# Patient Record
Sex: Female | Born: 1950
Health system: Southern US, Community
[De-identification: ages and names within clinical notes are randomized; demographics above are authoritative.]

## PROBLEM LIST (undated history)

## (undated) DIAGNOSIS — F17200 Nicotine dependence, unspecified, uncomplicated: Secondary | ICD-10-CM

## (undated) DIAGNOSIS — C801 Malignant (primary) neoplasm, unspecified: Secondary | ICD-10-CM

## (undated) DIAGNOSIS — K219 Gastro-esophageal reflux disease without esophagitis: Secondary | ICD-10-CM

## (undated) DIAGNOSIS — R630 Anorexia: Secondary | ICD-10-CM

## (undated) DIAGNOSIS — J3800 Paralysis of vocal cords and larynx, unspecified: Secondary | ICD-10-CM

## (undated) DIAGNOSIS — IMO0002 Reserved for concepts with insufficient information to code with codable children: Secondary | ICD-10-CM

## (undated) DIAGNOSIS — E0590 Thyrotoxicosis, unspecified without thyrotoxic crisis or storm: Secondary | ICD-10-CM

## (undated) DIAGNOSIS — Z860100 Personal history of colon polyps, unspecified: Secondary | ICD-10-CM

## (undated) DIAGNOSIS — J438 Other emphysema: Secondary | ICD-10-CM

## (undated) DIAGNOSIS — K624 Stenosis of anus and rectum: Secondary | ICD-10-CM

## (undated) DIAGNOSIS — K5732 Diverticulitis of large intestine without perforation or abscess without bleeding: Secondary | ICD-10-CM

## (undated) DIAGNOSIS — N824 Other female intestinal-genital tract fistulae: Secondary | ICD-10-CM

## (undated) DIAGNOSIS — M810 Age-related osteoporosis without current pathological fracture: Secondary | ICD-10-CM

## (undated) HISTORY — PX: COLOSTOMY: SHX63

## (undated) HISTORY — DX: Age-related osteoporosis without current pathological fracture: M81.0

## (undated) HISTORY — DX: Paralysis of vocal cords and larynx, unspecified: J38.00

## (undated) HISTORY — PX: DILATION AND CURETTAGE OF UTERUS: SHX78

## (undated) HISTORY — PX: COLONOSCOPY: SHX174

## (undated) HISTORY — PX: OTHER SURGICAL HISTORY: SHX169

## (undated) HISTORY — DX: Other emphysema: J43.8

## (undated) HISTORY — DX: Reserved for concepts with insufficient information to code with codable children: IMO0002

## (undated) HISTORY — DX: Stenosis of anus and rectum: K62.4

## (undated) HISTORY — PX: TUBAL LIGATION: SHX77

## (undated) HISTORY — PX: SIGMOID RESECTION / RECTOPEXY: SUR1294

## (undated) HISTORY — DX: Other female intestinal-genital tract fistulae: N82.4

## (undated) HISTORY — PX: WRIST SURGERY: SHX841

## (undated) HISTORY — DX: Personal history of colon polyps, unspecified: Z86.0100

## (undated) HISTORY — DX: Thyrotoxicosis, unspecified without thyrotoxic crisis or storm: E05.90

## (undated) HISTORY — PX: POLYPECTOMY: SHX149

## (undated) HISTORY — DX: Personal history of colonic polyps: Z86.010

## (undated) HISTORY — DX: Diverticulitis of large intestine without perforation or abscess without bleeding: K57.32

## (undated) HISTORY — DX: Anorexia: R63.0

## (undated) HISTORY — DX: Gastro-esophageal reflux disease without esophagitis: K21.9

## (undated) HISTORY — DX: Malignant (primary) neoplasm, unspecified: C80.1

## (undated) HISTORY — DX: Nicotine dependence, unspecified, uncomplicated: F17.200

---

## 1997-10-10 ENCOUNTER — Ambulatory Visit (HOSPITAL_COMMUNITY): Admission: RE | Admit: 1997-10-10 | Discharge: 1997-10-10 | Payer: Self-pay | Admitting: Endocrinology

## 1998-03-20 ENCOUNTER — Ambulatory Visit (HOSPITAL_COMMUNITY): Admission: RE | Admit: 1998-03-20 | Discharge: 1998-03-20 | Payer: Self-pay | Admitting: Internal Medicine

## 1998-04-02 ENCOUNTER — Inpatient Hospital Stay (HOSPITAL_COMMUNITY): Admission: AD | Admit: 1998-04-02 | Discharge: 1998-04-13 | Payer: Self-pay | Admitting: Internal Medicine

## 1998-04-02 ENCOUNTER — Encounter: Payer: Self-pay | Admitting: Internal Medicine

## 1998-06-01 ENCOUNTER — Ambulatory Visit (HOSPITAL_BASED_OUTPATIENT_CLINIC_OR_DEPARTMENT_OTHER): Admission: RE | Admit: 1998-06-01 | Discharge: 1998-06-01 | Payer: Self-pay | Admitting: General Surgery

## 1998-07-27 ENCOUNTER — Inpatient Hospital Stay (HOSPITAL_COMMUNITY): Admission: RE | Admit: 1998-07-27 | Discharge: 1998-07-31 | Payer: Self-pay | Admitting: General Surgery

## 1999-11-19 ENCOUNTER — Other Ambulatory Visit: Admission: RE | Admit: 1999-11-19 | Discharge: 1999-11-19 | Payer: Self-pay | Admitting: Obstetrics and Gynecology

## 2001-01-08 ENCOUNTER — Other Ambulatory Visit: Admission: RE | Admit: 2001-01-08 | Discharge: 2001-01-08 | Payer: Self-pay | Admitting: Obstetrics and Gynecology

## 2001-09-11 ENCOUNTER — Emergency Department (HOSPITAL_COMMUNITY): Admission: EM | Admit: 2001-09-11 | Discharge: 2001-09-11 | Payer: Self-pay

## 2001-09-11 ENCOUNTER — Encounter (INDEPENDENT_AMBULATORY_CARE_PROVIDER_SITE_OTHER): Payer: Self-pay | Admitting: *Deleted

## 2001-09-13 ENCOUNTER — Ambulatory Visit (HOSPITAL_COMMUNITY): Admission: RE | Admit: 2001-09-13 | Discharge: 2001-09-13 | Payer: Self-pay | Admitting: Internal Medicine

## 2001-09-13 ENCOUNTER — Encounter: Payer: Self-pay | Admitting: Internal Medicine

## 2001-11-03 ENCOUNTER — Ambulatory Visit (HOSPITAL_COMMUNITY): Admission: RE | Admit: 2001-11-03 | Discharge: 2001-11-03 | Payer: Self-pay | Admitting: Internal Medicine

## 2001-11-04 ENCOUNTER — Encounter: Payer: Self-pay | Admitting: Internal Medicine

## 2002-01-12 ENCOUNTER — Other Ambulatory Visit: Admission: RE | Admit: 2002-01-12 | Discharge: 2002-01-12 | Payer: Self-pay | Admitting: Obstetrics and Gynecology

## 2002-01-18 ENCOUNTER — Ambulatory Visit (HOSPITAL_COMMUNITY): Admission: RE | Admit: 2002-01-18 | Discharge: 2002-01-18 | Payer: Self-pay | Admitting: Obstetrics and Gynecology

## 2002-01-18 ENCOUNTER — Encounter: Payer: Self-pay | Admitting: Obstetrics and Gynecology

## 2002-07-05 ENCOUNTER — Encounter: Admission: RE | Admit: 2002-07-05 | Discharge: 2002-07-05 | Payer: Self-pay | Admitting: Internal Medicine

## 2002-07-05 ENCOUNTER — Encounter: Payer: Self-pay | Admitting: Internal Medicine

## 2002-09-27 ENCOUNTER — Encounter: Admission: RE | Admit: 2002-09-27 | Discharge: 2002-12-26 | Payer: Self-pay | Admitting: Internal Medicine

## 2003-02-07 ENCOUNTER — Encounter: Payer: Self-pay | Admitting: Internal Medicine

## 2003-02-07 ENCOUNTER — Encounter: Admission: RE | Admit: 2003-02-07 | Discharge: 2003-02-07 | Payer: Self-pay | Admitting: Internal Medicine

## 2004-07-17 ENCOUNTER — Ambulatory Visit: Payer: Self-pay | Admitting: Internal Medicine

## 2004-08-22 ENCOUNTER — Ambulatory Visit (HOSPITAL_COMMUNITY): Admission: RE | Admit: 2004-08-22 | Discharge: 2004-08-22 | Payer: Self-pay | Admitting: Internal Medicine

## 2004-09-09 ENCOUNTER — Ambulatory Visit: Payer: Self-pay | Admitting: Internal Medicine

## 2004-09-13 ENCOUNTER — Encounter: Admission: RE | Admit: 2004-09-13 | Discharge: 2004-12-12 | Payer: Self-pay | Admitting: Internal Medicine

## 2004-11-07 ENCOUNTER — Ambulatory Visit: Payer: Self-pay | Admitting: Internal Medicine

## 2004-11-28 ENCOUNTER — Ambulatory Visit: Payer: Self-pay | Admitting: Endocrinology

## 2004-12-03 ENCOUNTER — Ambulatory Visit: Payer: Self-pay | Admitting: Internal Medicine

## 2004-12-31 ENCOUNTER — Ambulatory Visit: Payer: Self-pay | Admitting: Endocrinology

## 2005-01-14 ENCOUNTER — Ambulatory Visit: Payer: Self-pay | Admitting: Endocrinology

## 2005-01-21 ENCOUNTER — Ambulatory Visit: Payer: Self-pay | Admitting: Endocrinology

## 2005-02-04 ENCOUNTER — Ambulatory Visit: Payer: Self-pay | Admitting: Endocrinology

## 2005-03-07 ENCOUNTER — Ambulatory Visit: Payer: Self-pay | Admitting: Endocrinology

## 2005-03-31 ENCOUNTER — Ambulatory Visit: Payer: Self-pay | Admitting: Endocrinology

## 2005-04-07 ENCOUNTER — Ambulatory Visit: Payer: Self-pay | Admitting: Endocrinology

## 2005-08-06 ENCOUNTER — Other Ambulatory Visit: Admission: RE | Admit: 2005-08-06 | Discharge: 2005-08-06 | Payer: Self-pay | Admitting: Obstetrics and Gynecology

## 2005-10-22 ENCOUNTER — Encounter: Payer: Self-pay | Admitting: Internal Medicine

## 2006-01-29 ENCOUNTER — Ambulatory Visit: Payer: Self-pay | Admitting: Endocrinology

## 2006-03-09 ENCOUNTER — Ambulatory Visit: Payer: Self-pay | Admitting: Endocrinology

## 2006-03-09 ENCOUNTER — Ambulatory Visit: Payer: Self-pay | Admitting: Internal Medicine

## 2006-04-08 ENCOUNTER — Ambulatory Visit: Payer: Self-pay | Admitting: Endocrinology

## 2006-05-22 ENCOUNTER — Ambulatory Visit: Payer: Self-pay | Admitting: Endocrinology

## 2006-08-20 ENCOUNTER — Ambulatory Visit: Payer: Self-pay | Admitting: Endocrinology

## 2006-11-24 ENCOUNTER — Ambulatory Visit: Payer: Self-pay | Admitting: Internal Medicine

## 2006-12-04 ENCOUNTER — Ambulatory Visit: Payer: Self-pay | Admitting: Internal Medicine

## 2006-12-04 ENCOUNTER — Encounter: Payer: Self-pay | Admitting: Internal Medicine

## 2007-09-13 ENCOUNTER — Encounter: Payer: Self-pay | Admitting: Endocrinology

## 2007-10-14 ENCOUNTER — Ambulatory Visit: Payer: Self-pay | Admitting: Endocrinology

## 2007-10-14 DIAGNOSIS — E1065 Type 1 diabetes mellitus with hyperglycemia: Secondary | ICD-10-CM

## 2007-10-14 DIAGNOSIS — IMO0002 Reserved for concepts with insufficient information to code with codable children: Secondary | ICD-10-CM | POA: Insufficient documentation

## 2007-10-14 LAB — CONVERTED CEMR LAB
Creatinine,U: 13.5 mg/dL
Hgb A1c MFr Bld: 10.2 % — ABNORMAL HIGH (ref 4.6–6.0)

## 2007-11-04 ENCOUNTER — Telehealth: Payer: Self-pay | Admitting: Endocrinology

## 2007-11-13 DIAGNOSIS — E109 Type 1 diabetes mellitus without complications: Secondary | ICD-10-CM | POA: Insufficient documentation

## 2007-11-13 DIAGNOSIS — F17200 Nicotine dependence, unspecified, uncomplicated: Secondary | ICD-10-CM

## 2007-11-13 DIAGNOSIS — K5730 Diverticulosis of large intestine without perforation or abscess without bleeding: Secondary | ICD-10-CM | POA: Insufficient documentation

## 2007-11-13 DIAGNOSIS — K5732 Diverticulitis of large intestine without perforation or abscess without bleeding: Secondary | ICD-10-CM

## 2007-11-13 DIAGNOSIS — K624 Stenosis of anus and rectum: Secondary | ICD-10-CM

## 2008-01-19 ENCOUNTER — Encounter: Payer: Self-pay | Admitting: Internal Medicine

## 2008-01-24 ENCOUNTER — Encounter: Admission: RE | Admit: 2008-01-24 | Discharge: 2008-01-24 | Payer: Self-pay | Admitting: Rheumatology

## 2008-01-31 ENCOUNTER — Encounter: Payer: Self-pay | Admitting: Internal Medicine

## 2008-05-08 ENCOUNTER — Encounter: Payer: Self-pay | Admitting: Endocrinology

## 2008-05-31 ENCOUNTER — Ambulatory Visit (HOSPITAL_COMMUNITY): Admission: RE | Admit: 2008-05-31 | Discharge: 2008-06-01 | Payer: Self-pay | Admitting: Otolaryngology

## 2008-05-31 ENCOUNTER — Encounter (INDEPENDENT_AMBULATORY_CARE_PROVIDER_SITE_OTHER): Payer: Self-pay | Admitting: Otolaryngology

## 2008-12-18 ENCOUNTER — Telehealth: Payer: Self-pay | Admitting: Endocrinology

## 2008-12-19 ENCOUNTER — Ambulatory Visit: Payer: Self-pay | Admitting: Endocrinology

## 2008-12-19 LAB — CONVERTED CEMR LAB
BUN: 19 mg/dL (ref 6–23)
CO2: 27 meq/L (ref 19–32)
Chloride: 103 meq/L (ref 96–112)
Creatinine, Ser: 0.9 mg/dL (ref 0.4–1.2)
Potassium: 5 meq/L (ref 3.5–5.1)

## 2009-03-20 ENCOUNTER — Telehealth: Payer: Self-pay | Admitting: Internal Medicine

## 2009-03-23 ENCOUNTER — Encounter: Payer: Self-pay | Admitting: Endocrinology

## 2009-09-21 ENCOUNTER — Encounter (INDEPENDENT_AMBULATORY_CARE_PROVIDER_SITE_OTHER): Payer: Self-pay | Admitting: *Deleted

## 2009-10-18 ENCOUNTER — Encounter (INDEPENDENT_AMBULATORY_CARE_PROVIDER_SITE_OTHER): Payer: Self-pay | Admitting: *Deleted

## 2009-10-18 ENCOUNTER — Encounter: Admission: RE | Admit: 2009-10-18 | Discharge: 2009-10-18 | Payer: Self-pay | Admitting: Otolaryngology

## 2009-11-13 ENCOUNTER — Encounter (INDEPENDENT_AMBULATORY_CARE_PROVIDER_SITE_OTHER): Payer: Self-pay | Admitting: *Deleted

## 2010-01-29 ENCOUNTER — Encounter (INDEPENDENT_AMBULATORY_CARE_PROVIDER_SITE_OTHER): Payer: Self-pay | Admitting: *Deleted

## 2010-02-08 ENCOUNTER — Ambulatory Visit: Payer: Self-pay | Admitting: Internal Medicine

## 2010-02-08 DIAGNOSIS — Z860100 Personal history of colon polyps, unspecified: Secondary | ICD-10-CM | POA: Insufficient documentation

## 2010-02-08 DIAGNOSIS — N824 Other female intestinal-genital tract fistulae: Secondary | ICD-10-CM

## 2010-02-08 DIAGNOSIS — M810 Age-related osteoporosis without current pathological fracture: Secondary | ICD-10-CM | POA: Insufficient documentation

## 2010-02-08 DIAGNOSIS — J438 Other emphysema: Secondary | ICD-10-CM

## 2010-02-08 DIAGNOSIS — R630 Anorexia: Secondary | ICD-10-CM

## 2010-02-08 DIAGNOSIS — C44309 Unspecified malignant neoplasm of skin of other parts of face: Secondary | ICD-10-CM | POA: Insufficient documentation

## 2010-02-08 DIAGNOSIS — E0590 Thyrotoxicosis, unspecified without thyrotoxic crisis or storm: Secondary | ICD-10-CM | POA: Insufficient documentation

## 2010-02-08 DIAGNOSIS — J3800 Paralysis of vocal cords and larynx, unspecified: Secondary | ICD-10-CM

## 2010-02-08 DIAGNOSIS — R109 Unspecified abdominal pain: Secondary | ICD-10-CM | POA: Insufficient documentation

## 2010-02-08 DIAGNOSIS — C44300 Unspecified malignant neoplasm of skin of unspecified part of face: Secondary | ICD-10-CM | POA: Insufficient documentation

## 2010-02-11 ENCOUNTER — Ambulatory Visit (HOSPITAL_COMMUNITY): Admission: RE | Admit: 2010-02-11 | Discharge: 2010-02-11 | Payer: Self-pay | Admitting: Internal Medicine

## 2010-02-11 ENCOUNTER — Ambulatory Visit: Payer: Self-pay | Admitting: Internal Medicine

## 2010-02-11 LAB — CONVERTED CEMR LAB
Basophils Absolute: 0.1 10*3/uL (ref 0.0–0.1)
Basophils Relative: 0.8 % (ref 0.0–3.0)
Eosinophils Absolute: 0.2 10*3/uL (ref 0.0–0.7)
Eosinophils Relative: 2 % (ref 0.0–5.0)
Lymphs Abs: 1.8 10*3/uL (ref 0.7–4.0)
MCHC: 34.4 g/dL (ref 30.0–36.0)
Monocytes Absolute: 0.6 10*3/uL (ref 0.1–1.0)
RBC: 3.74 M/uL — ABNORMAL LOW (ref 3.87–5.11)
RDW: 14.8 % — ABNORMAL HIGH (ref 11.5–14.6)
Saturation Ratios: 5.4 % — ABNORMAL LOW (ref 20.0–50.0)
Transferrin: 223.7 mg/dL (ref 212.0–360.0)

## 2010-02-12 ENCOUNTER — Encounter: Payer: Self-pay | Admitting: Internal Medicine

## 2010-02-14 ENCOUNTER — Ambulatory Visit: Payer: Self-pay | Admitting: Internal Medicine

## 2010-07-25 NOTE — Letter (Signed)
Summary: New Patient letter  [facility]  [address] [place], [place] [address]   Phone: [phone]  Fax: [phone]       01/29/2010 MRN: [id number]  [facility] [address] [place], [place]  [address]  Dear Ms. Date,  Welcome to the Gastroenterology Division at [facility].    You are scheduled to see Dr.  Lina Sar on April 18, 2010 at 10:30am on the 3rd floor at [facility], [address]  We ask that you try to arrive at our office 15 minutes prior to your appointment time to allow for check-in.  We would like you to complete the enclosed self-administered evaluation form prior to your visit and bring it with you on the day of your appointment.  We will review it with you.  Also, please bring a complete list of all your medications or, if you prefer, bring the medication bottles and we will list them.  Please bring your insurance card so that we may make a copy of it.  If your insurance requires a referral to see a specialist, please bring your referral form from your primary care physician.  Co-payments are due at the time of your visit and may be paid by cash, check or credit card.     Your office visit will consist of a consult with your physician (includes a physical exam), any laboratory testing he/she may order, scheduling of any necessary diagnostic testing (e.g. x-ray, ultrasound, CT-scan), and scheduling of a procedure (e.g. Endoscopy, Colonoscopy) if required.  Please allow enough time on your schedule to allow for any/all of these possibilities.    If you cannot keep your appointment, please call [phone] to cancel or reschedule prior to your appointment date.  This allows [place] the opportunity to schedule an appointment for another patient in need of care.  If you do not cancel or reschedule by 5 p.m. the business day prior to your appointment date, you will be charged a $50.00 late cancellation/no-show fee.    Thank you for  choosing [facility] for your medical needs.  We appreciate the opportunity to care for you.  Please visit [place] at our website  to learn more about our practice.                     Sincerely,                                                             The Gastroenterology Division

## 2010-07-25 NOTE — Op Note (Signed)
Summary: Dilation of Sigmoid Stricture                         [facility]  Patient:    DELORICE, BANNISTER Visit Number: [id number] MRN: [id number]          Service Type: END Location: ENDO Attending Physician:  Mervin Hack Dictated by:   Hedwig Morton. Juanda Chance, M.D. [facility] Admit Date:  11/03/2001   CC:         Claire Shown. Zachery Dakins, M.D.   Procedure Report  PROCEDURE:  Dilatation of sigmoid stricture.  INDICATION:  This 60 year old white female has had an anastomotic stricture in the sigmoid colon at the level of 7 cm from the rectum.  This was dilated on September 13, 2001, after she presented to emergency room with partial sigmoid obstruction.  Patient has previous history of diverticulosis, diverticulitis, status post sigmoid resection with primary anastomosis.  Since the last dilatation she has remained asymptomatic, taking stool softeners on a daily basis.  She is undergoing repeat sigmoidoscopy to assess the success of the dilatation and to proceed with another dilatation.  The last time the dilators used were 14 and 15 Savary.  ENDOSCOPE:  Olympus single-channel video scope.  SEDATION:  Versed 7.5 mg IV, Demerol 70 mg IV.  FINDINGS:  Olympus single-channel pediatric upper endoscope was passed under direct vision through the rectum to sigmoid colon.  The sigmoid anastomosis was located at 7 cm from the anal verge, and it allowed at this time the endoscope to traverse freely through the stricture.  The diameter of the stricture was at least 14-15 mm.  There were no acute inflammatory changes of the stricture.  There were numerous diverticula proximal to the stricture. Endoscope passed all the way up to 35 cm from the rectum.  Guidewire was placed through the endoscope.  Endoscope was then retracted and Savary dilators were passed over the guidewire blindly using 15, 16, 17, and  18 mm dilators.  There was a small amount of blood on the last two dilators.  The patient  tolerated the procedure well.  IMPRESSION: 1. Benign anastomotic stricture of the sigmoid colon, status post dilatation    to 18 mm. 2. Diverticulosis of the left colon.  PLAN: 1. Resume previous diet. 2. Continue stool softeners on a daily basis. 3. Reassess in six months. Dictated by:   Hedwig Morton. Juanda Chance, M.D. [facility] Attending Physician:  Mervin Hack DD:  11/03/01 TD:  11/04/01 Job: [id number]

## 2010-07-25 NOTE — Procedures (Signed)
Summary: Instruction for procedure/[place]  Instruction for procedure/[place]   Imported By: Sherian Rein 02/12/2010 08:57:17  _____________________________________________________________________  External Attachment:    Type:   Image     Comment:   External Document

## 2010-07-25 NOTE — Op Note (Signed)
Summary: Flexible Sigmoidoscopy                         [facility]  Patient:    Michaela Walsh, Michaela Walsh Visit Number: [id number] MRN: [id number]          Service Type: END Location: ENDO Attending Physician:  Mervin Hack Dictated by:   Hedwig Morton. Juanda Chance, M.D. [facility] Admit Date:  09/13/2001   CC:         Anselm Pancoast. Zachery Dakins, M.D.   Procedure Report  PROCEDURE:  Flexible sigmoidoscopy and dilatation of anastomotic stricture.  ENDOSCOPIST:  Hedwig Morton. Juanda Chance, M.D.  INDICATIONS:  This 60 year old white female has presented to emergency room 2 days ago with acute left lower quadrant abdominal pain as well as suprapubic abdominal pain, associated with constipation.  She has a history of sigmoid resection for diverticulitis with perforation.  On CT scan of the abdomen and pelvis it became apparent that at the level of the rectosigmoid the colon developed a benign anastomotic stricture.  On digital examination this confirmed.  At tip of my finger there was a stricture which did not allow the entire finger to pass through it.  The patient has been dysimpacted over the weekend using GoLYTELY as well as other oral laxatives and a fleets enema and is now undergoing flexible sigmoidoscopy with assessment and evaluation of the anastomotic stricture and ______ dilatation.  ENDOSCOPE:  Olympus single channel videoscope.  SEDATION:  Versed 7 mg IV, Demerol 60 mg IV.  DESCRIPTION OF PROCEDURE:  The Olympus single channel videoscope was passed under direct vision to the sigmoid colon.  The patient was monitored by pulse oximeter.  Her oxygen saturation were satisfactory.   Anal canal and rectal ampulla was normal.  At the level of about 7 to 8 cm from the rectum there was a tight anastomotic stricture which initially did not allow the colonoscope to pass and transverse through into the colon.  We had to withdraw the colonoscope and use the pediatric upper endoscope which was admitted  through the stricture with mild pressure.  The diameter of the stricture was about 8 to 9 mm.  There were several ulcerations at the circumference of the stricture. Proximal to the stricture in the sigmoid colon were numerous diverticula, colonoscope was examined up to the level of 45 cm or distal to the splenic flexure.  At that point endoscope was slowly retracted.  Guidewire was placed through the upper endoscope into the sigmoid colon, endoscope was retracted and several dilators passed over the guidewire through the stricture.  The patient had a mild to moderate amount of discomfort with the dilatation using 14 mm, 15 mm.  The patient was re-endoscoped after the first dilatations to confirm dilatation of the stricture and no evidence of mucosal injury to the rest of the colon.  Guidewire was then reinserted and 2 more dilators were used to dilate the stricture using 16 and 77 mm dilators.  Again, guidewire was retracted and colon was re-endoscoped to confirm dilatation of the strictu and absence of any injury to the surrounding mucosa.  Next, the guidewire was placed again up to the level of 30 cm, and 3 more dilators were used, 18, 19 and 20 mm, several dilators passed over the guidewire without fluoroscopic guidance.  The stricture was rather tight and required some gentle pressure to be able to pass the largest dilator through. Re-endoscopy confirmed some bleeding from the dilated stricture but  no major tears.  The patient tolerated the procedure well.  IMPRESSION: 1. Rectosigmoid stricture at the anastomosis at the level of 7 cm., from the    anal verge. 2. Diverticulosis of the left colon. 3. Dilatation of the rectosigmoid stricture to 20 mm.  PLAN: 1. Resume previous diet. 2. Stool softeners on a daily basis. 3. Re-endoscope patient in 2-3 months to assess the healing of the stricture    and the surrounding ulcerations and to assess if further dilatation is    needed  at this point. Dictated by:   Hedwig Morton. Juanda Chance, M.D. [facility] Attending Physician:  Mervin Hack DD:  09/13/01 TD:  09/14/01 Job: [id number]

## 2010-07-25 NOTE — Assessment & Plan Note (Signed)
Summary: abd pain, decrease appetite...em    History of Present Illness Visit Type: Initial Consult Primary GI MD: Lina Sar MD Primary Jaydalynn Olivero: Darci Needle, MD Requesting Aariah Godette: Darci Needle, MD Chief Complaint: Patient complains of generalized abdominal pain that started about a month ago. She also complains of loss of appetite that started about the same time as the abdominal pain. Patinet has been dxed with a paralized vocal cord and trying to decide on if she would like to have surgery or not.  History of Present Illness:   This is a 60 year old white female with abdominal pain and decreased appetite. She has lost some weight. There is a history of  perforated diverticulitis in 1999, status post sigmoid resection and diverting colostomy with Hartman's pouch which was later taken down in 2000. She developed an anastomotic stricture at 7-8 cm from the rectum which was dilated in March and May 2003. Her last colonoscopy in June 2008 showed no evidence of the anastomotic stricture. She had a small polyp which consisted of lymphoid aggregates. Her abdominal and back pain is not related to meals or bowel movements. Patient denies drinking alcohol but admits to taking aspirin in excess for control of her back pain. Her energy level has been very low. She has severe degenerative joint disease of the lumbosacral spine. She is a diabetic.   GI Review of Systems    Reports abdominal pain, chest pain, heartburn, and  weight loss.     Location of  Abdominal pain: generalized. Weight loss of 12lbs pounds over 1 year.   Denies acid reflux, belching, bloating, dysphagia with liquids, dysphagia with solids, loss of appetite, nausea, vomiting, vomiting blood, and  weight gain.        Denies anal fissure, black tarry stools, change in bowel habit, constipation, diarrhea, diverticulosis, fecal incontinence, heme positive stool, hemorrhoids, irritable bowel syndrome, jaundice, light color stool, liver  problems, rectal bleeding, and  rectal pain. Preventive Screening-Counseling & Management  Alcohol-Tobacco     Smoking Status: quit    Current Medications (verified): 1)  Evista 60 Mg  Tabs (Raloxifene Hcl) .... Take One Tab Daily 2)  Calcium-D 600-200 Mg-Unit Tabs (Calcium Carbonate-Vitamin D) .... Take 2 Tablets By Mouth Two Times A Day 3)  Lantus Solostar 100 Unit/ml  Soln (Insulin Glargine) .... Use 18 Units Qhs 4)  Humalog Kwikpen 100 Unit/ml  Soln (Insulin Lispro (Human)) .... 5 Units Three Times A Day (Qac) 5)  Onetouch Ultra Test  Strp (Glucose Blood) .... Two Times A Day, and Lancets 250.01 6)  Kombiglyze Xr 2.10-998 Mg Xr24h-Tab (Saxagliptin-Metformin) .... Take 1 Tablet By Mouth Two Times A Day 7)  Multivitamins  Tabs (Multiple Vitamin) .... Take One By Mouth Once Daily  Allergies (verified): No Known Drug Allergies  Past History:  Past Medical History: Reviewed history from 02/08/2010 and no changes required. 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 colostomy  sigmoid resection with takedown of colostomy D&C x 2 Excision and Closure of Rectovaginal Fistula C-Section x 1 Tubal Ligation with subsequent tubal pregnancy requiring  redo of the tubal ligation Right Wrist Surgery parotid gland resection  Family History: Family History of  Heart Disease: Father No FH of Colon Cancer: Family History of Colon Polyps: daughter  Social History: Alcohol Use - no Illicit Drug Use - no Patient is a former smoker.  Daily Caffeine Use one per day Occupation: customer service married with four daughters Smoking Status:  quit  Review of Systems       The patient complains of back pain, headaches-new, and voice change.  The patient denies allergy/sinus, anemia, anxiety-new, arthritis/joint pain, blood in urine, breast changes/lumps, change in vision, confusion, cough, coughing up blood, depression-new, fainting, fatigue, fever, hearing problems, heart murmur, heart rhythm changes, itching, menstrual pain, muscle pains/cramps, night sweats, nosebleeds, pregnancy symptoms, shortness of breath, skin rash, sleeping problems, sore throat, swelling of feet/legs, swollen lymph glands, thirst - excessive , urination - excessive , urination changes/pain, urine leakage, and vision changes.         Pertinent positive and negative review of systems were noted in the above HPI. All other ROS was otherwise negative.   Vital Signs:  Patient profile:   60 year old female Height:      61 inches Weight:      108.4 pounds BMI:     20.56 Pulse rate:   74 / minute Pulse rhythm:   regular BP sitting:   120 / 64  (left arm) Cuff size:   regular  Vitals Entered By: Harlow Mares CMA Duncan Dull) (February 08, 2010 2:33 PM)  Physical Exam  General:  colored and oriented very cooperative. There is than Eyes:  nonicteric Mouth:  tongue is papilated Neck:  Supple; no masses or thyromegaly. Lungs:  Clear throughout to auscultation. Heart:  Regular rate and rhythm; no murmurs, rubs,  or bruits. Abdomen:  soft scaphoid abdomen with pulse colostomy a hernia and left middle quadrant which is easily reducible. Normoactive bowel sounds. No palpable mass. Liver edge at costal margin Rectal:  salt Hemoccult-negative stool Extremities:  No clubbing, cyanosis, edema  or deformities noted. Skin:  Intact without significant lesions or rashes. Psych:  Alert and cooperative. Normal mood and affect.   Impression & Recommendations:  Problem # 1:  ABDOMINAL PAIN, UNSPECIFIED SITE (ICD-789.00) diffuse abdominal pain as well as back pain associated with loss of appetite and mild weight loss. Patient takes excess aspirin for control of back pain. Although she is Hemoccult-negative she has a history of anemia. We will proceed with upper endoscopy. She is up-to-date on her colonoscopy. We will check her amylase lipase to rule out pancreatitis as well as her of her CBC and iron studies to rule out GI blood loss. I have given her samples of Prilosec 20 mg daily while awaiting upper endoscopy she will also start tramadol 50 mg p.r.n. back pain he may need CT scan of the abdomen and pelvis to further evaluate her abdominal pain Orders: TLB-CBC Platelet - w/Differential (85025-CBCD) TLB-IBC Pnl (Iron/FE;Transferrin) (83550-IBC) TLB-B12, Serum-Total ONLY (04540-J81) TLB-Sedimentation Rate (ESR) (85652-ESR) TLB-Amylase (82150-AMYL) TLB-Lipase (83690-LIPASE) ZEGD (ZEGD)  Patient Instructions: 1)  CBC, iron studies and B12 today. 2)  Upper endoscopy with biopsies. 3)  Begin tramadol 50 mg daily. 4)  consider further evaluation of the abdominal pain with CT scan of the abdomen and pelvis. 5)  Copy sent to : Dr Juleen China 6)  The medication list was reviewed and reconciled.  All changed / newly prescribed medications were explained.  A complete medication list was provided to the patient /  caregiver. 7)  The medication list was reviewed and reconciled.  All changed / newly prescribed medications were explained.  A complete medication list was provided to the patient / caregiver. Prescriptions: TRAMADOL HCL 50 MG TABS (TRAMADOL HCL) Take 1 tablet by mouth once daily  #30 x 1   Entered by:   Lamona Curl CMA (AAMA)   Authorized by:   Hart Carwin MD   Signed by:   Lamona Curl CMA (AAMA) on 02/08/2010   Method used:   Electronically to        [facility] [id number]* (retail)       [address]       [address]       [place], [place]  [address]       Ph: [phone]       Fax: [phone]   RxID:   [id number]

## 2010-07-25 NOTE — Procedures (Signed)
Summary: Upper Endoscopy  Patient: Lafawn Lenoir Note: All result statuses are Final unless otherwise noted.  Tests: (1) Upper Endoscopy (EGD)   EGD Upper Endoscopy       DONE     [facility]     [address] [place], [place]  [address]           ENDOSCOPY PROCEDURE REPORT           PATIENT:  Kristain, Filo  MR#:  [id number]     BIRTHDATE:  [birth year], 58 yrs. old  GENDER:  female           ENDOSCOPIST:  Hedwig Morton. Juanda Chance, MD     Referred by:  Adela Lank, M.D.           PROCEDURE DATE:  02/11/2010     PROCEDURE:  EGD with biopsy     ASA CLASS:  Class II     INDICATIONS:  abdominal pain, weight loss, iron deficiency anemia     diffuse abd. pain, 5% iron saturation, Hgb 11.8,     clon 2008 showed open sigmoid anastomosis     chronic back pain     took ASA for back pain           MEDICATIONS:   Versed 6 mg, Fentanyl 50     TOPICAL ANESTHETIC:  Cetacaine Spray           DESCRIPTION OF PROCEDURE:   After the risks benefits and     alternatives of the procedure were thoroughly explained, informed     consent was obtained.  The  endoscope was introduced through the     mouth and advanced to the second portion of the duodenum, without     limitations.  The instrument was slowly withdrawn as the mucosa     was fully examined.     <<PROCEDUREIMAGES>>           A hiatal hernia was found (see image1, image5, and image4). 2 cm     reducible hiatal hernia with a nonobstructing fibrous ring  other     findings. With standard forceps, a biopsy was obtained and sent to     pathology (see image4, image3, and image2). small bowl biopsies to     r/o sprue    Retroflexed views revealed no abnormalities.    The     scope was then withdrawn from the patient and the procedure     completed.           COMPLICATIONS:  None           ENDOSCOPIC IMPRESSION:     1) Hiatal hernia     2) Other findings     small reducible hiatal hernia, nothing to account for abd. pain     or  anemia     RECOMMENDATIONS:     1) Await biopsy results     CT scan of the abd/pelvis with IV and oral contrast     FeSO4 325 mg po tid     continue Tramadol for pain           REPEAT EXAM:  In 0 year(s) for.           ______________________________     Hedwig Morton. Juanda Chance, MD           CC:           n.     eSIGNED:   Hedwig Morton. Juanda Chance  at 02/11/2010 11:42 AM           Bufford Lope, [id number]  Note: An exclamation mark (!) indicates a result that was not dispersed into the flowsheet. Document Creation Date: 02/11/2010 11:44 AM _______________________________________________________________________  (1) Order result status: Final Collection or observation date-time: 02/11/2010 11:34 Requested date-time:  Receipt date-time:  Reported date-time:  Referring Physician:   Ordering Physician: Lina Sar [phone]) Specimen Source:  Source: Launa Grill Order Number: [id number] Lab site:   Appended Document: Upper Endoscopy Patient  is scheduled for CT scan [facility] 02/14/10 8:30.  Patient  and husband are aware of appointment details.  Written instructions provided.  Patient  will have BUN and Creatnine drawn today at [facility]   Clinical Lists Changes  Orders: Added new Referral order of CT Abdomen/Pelvis with Contrast (CT Abd/Pelvis w/con) - Signed

## 2010-07-25 NOTE — Letter (Signed)
Summary: [facility] Bi[facility] Results  [facility]  [address] [place], [place] [address]   Phone: [phone]  Fax: [phone]        February 12, 2010 MRN: [id number]    [facility] [address] [place], [place]  [address]    Dear Ms. Holtzclaw,  I am pleased to inform you that the biopsies taken during your recent endoscopic examination did not show any evidence of cancer upon pathologic examination.The biopsy of Your small intestine shows normal tissue  Additional information/recommendations:  __No further action is needed at this time.  Please follow-up with      your primary care physician for your other healthcare needs.  __ Please call [phone] to schedule a return visit to review      your condition.  __ Continue with the treatment plan as outlined on the day of your      exam.  __ You should have a repeat endoscopic examination for this problem              in _ months/years.   Please call us if you are having persistent problems or have questions about your condition that have not been fully answered at this time.  Sincerely,  Hart Carwin MD  This letter has been electronically signed by your physician.

## 2010-07-25 NOTE — Letter (Signed)
Summary: Diabetic Instructions   Gastroenterology  [address] [place], [place] [address]   Phone: [phone]  Fax: [phone]    Michaela Walsh 07/01/1950 MRN: [id number]   _  x_   ORAL DIABETIC MEDICATION INSTRUCTIONS  The day before your procedure:   Take your diabetic pill as you do normally  The day of your procedure:   Do not take your diabetic pill    We will check your blood sugar levels during the admission process and again in Recovery before discharging you home  ________________________________________________________________________  _ x _   INSULIN (LONG ACTING) MEDICATION INSTRUCTIONS (Lantus)   The day before your procedure:   Take  your regular evening dose    The day of your procedure:   Do not take your morning dose    _ x _   INSULIN (SHORT ACTING) MEDICATION INSTRUCTIONS (Humulog,)   The day before your procedure:   Do not take your evening dose   The day of your procedure:   Do not take your morning dose

## 2010-07-25 NOTE — Letter (Signed)
Summary: EGD Instructions  [facility]  [address] [place], [place] [address]   Phone: [phone]  Fax: [phone]       Michaela Walsh    [birth year]    MRN: [id number]       Procedure Day /Date: Monday 02/11/10       Arrival Time:  9:30 am       Procedure Time: 10:30 am     Location of Procedure:                     _X  _ [facility] ( Outpatient Registration)    PREPARATION FOR ENDOSCOPY   On_ Monday  02/11/10 THE DAY OF THE PROCEDURE:  1.   No solid foods, milk or milk products are allowed after midnight the night before your procedure.  2.   Do not drink anything colored red or purple.  Avoid juices with pulp.  No orange juice.  3.  You may drink clear liquids until 8:30 am, which is 2 hours before your procedure.                                                                                                CLEAR LIQUIDS INCLUDE: Water Jello Ice Popsicles Tea (sugar ok, no milk/cream) Powdered fruit flavored drinks Coffee (sugar ok, no milk/cream) Gatorade Juice: apple, white grape, white cranberry  Lemonade Clear bullion, consomm, broth Carbonated beverages (any kind) Strained chicken noodle soup Hard Candy   MEDICATION INSTRUCTIONS  Unless otherwise instructed, you should take regular prescription medications with a small sip of water as early as possible the morning of your procedure.  Diabetic patients - see separate instructions.   Additional medication instructions: _             OTHER INSTRUCTIONS  You will need a responsible adult at least 59 years of age to accompany you and drive you home.   This person must remain in the waiting room during your procedure.  Wear loose fitting clothing that is easily removed.  Leave jewelry and other valuables at home.  However, you may wish to bring a book to read or an iPod/MP3 player to listen to music as you wait for your procedure to start.  Remove all body piercing  jewelry and leave at home.  Total time from sign-in until discharge is approximately 2-3 hours.  You should go home directly after your procedure and rest.  You can resume normal activities the day after your procedure.  The day of your procedure you should not:   Drive   Make legal decisions   Operate machinery   Drink alcohol   Return to work  You will receive specific instructions about eating, activities and medications before you leave.    The above instructions have been reviewed and explained to me by   _______________________    I fully understand and can verbalize these instructions _____________________________ Date _________

## 2010-09-06 LAB — BASIC METABOLIC PANEL
BUN: 18 mg/dL (ref 6–23)
CO2: 22 mEq/L (ref 19–32)
Chloride: 103 mEq/L (ref 96–112)

## 2010-10-18 ENCOUNTER — Telehealth: Payer: Self-pay | Admitting: Internal Medicine

## 2010-10-18 NOTE — Telephone Encounter (Signed)
Patient's husband calling to report that for the last 2-3 weeks, patient has had loose stools and her stomach is bothering her. Patient thinks her ulcer is acting up. She has been using Goodies some. C/O right side pain at waist area. Denies fever, bleeding, nausea or vomiting. Instructed patient's husband to have patient stop using Goodies. He states she has taken Omeprazole in the past and he will have her restart it daily. Patient's husband would like patient to be seen. Offered OV early next week with extender but patient's husband wanted late next week.Scheduled patient on 10/24/10 at 3:00PM with Mike Gip, PA.

## 2010-10-19 NOTE — Telephone Encounter (Signed)
Increase Prilosec to 20 mg po bid. Will likely need a repeat EGD. CT scan 01/2010 showed an "? Ulcer?", not confirmed on EGD. Agree with seeing Amy  PA.

## 2010-10-21 NOTE — Telephone Encounter (Signed)
Spoke with patient's husband and he will have patient increase Prilosec to BID

## 2010-10-21 NOTE — Telephone Encounter (Signed)
Left message for patient's husband to call me back.

## 2010-10-24 ENCOUNTER — Other Ambulatory Visit (INDEPENDENT_AMBULATORY_CARE_PROVIDER_SITE_OTHER): Payer: BC Managed Care – PPO

## 2010-10-24 ENCOUNTER — Ambulatory Visit (INDEPENDENT_AMBULATORY_CARE_PROVIDER_SITE_OTHER): Payer: BC Managed Care – PPO | Admitting: Physician Assistant

## 2010-10-24 ENCOUNTER — Encounter: Payer: Self-pay | Admitting: Physician Assistant

## 2010-10-24 VITALS — BP 110/64 | HR 60 | Wt 105.0 lb

## 2010-10-24 DIAGNOSIS — R1013 Epigastric pain: Secondary | ICD-10-CM

## 2010-10-24 DIAGNOSIS — Z9889 Other specified postprocedural states: Secondary | ICD-10-CM

## 2010-10-24 DIAGNOSIS — Z85850 Personal history of malignant neoplasm of thyroid: Secondary | ICD-10-CM

## 2010-10-24 DIAGNOSIS — Z9049 Acquired absence of other specified parts of digestive tract: Secondary | ICD-10-CM

## 2010-10-24 DIAGNOSIS — Z8719 Personal history of other diseases of the digestive system: Secondary | ICD-10-CM

## 2010-10-24 LAB — CBC WITH DIFFERENTIAL/PLATELET
Basophils Absolute: 0.1 10*3/uL (ref 0.0–0.1)
Basophils Relative: 1 % (ref 0.0–3.0)
Eosinophils Absolute: 0.2 10*3/uL (ref 0.0–0.7)
HCT: 36.7 % (ref 36.0–46.0)
Hemoglobin: 12.6 g/dL (ref 12.0–15.0)
Lymphocytes Relative: 15.8 % (ref 12.0–46.0)
MCHC: 34.3 g/dL (ref 30.0–36.0)
MCV: 92.1 fl (ref 78.0–100.0)
Monocytes Absolute: 0.5 10*3/uL (ref 0.1–1.0)
Monocytes Relative: 4.9 % (ref 3.0–12.0)
Neutro Abs: 8.5 10*3/uL — ABNORMAL HIGH (ref 1.4–7.7)
RBC: 3.98 Mil/uL (ref 3.87–5.11)
WBC: 11 10*3/uL — ABNORMAL HIGH (ref 4.5–10.5)

## 2010-10-24 MED ORDER — OMEPRAZOLE 20 MG PO CPDR: 20.0000 mg | DELAYED_RELEASE_CAPSULE | Freq: Two times a day (BID) | ORAL | Status: DC

## 2010-10-24 MED ORDER — TRAMADOL HCL 50 MG PO TABS: ORAL_TABLET | ORAL | Status: DC

## 2010-10-24 NOTE — Patient Instructions (Signed)
You have been scheduled for an endoscopy. Please follow written instructions given to you at your visit today.  Please follow diabetic instructions given in preparation for your endoscopy. Take omeprazole 20 mg, 1 tablet by mouth twice daily. We have sent a prescription of this to your pharmacy. Take Tramadol 50 mg 1 tablet by mouth every 6 hours as needed. We have sent a prescription of this to your pharmacy as well. STOP taking Goody Powders!!!  Your physician has requested that you go to the basement for the following lab work before leaving today: CBC

## 2010-10-25 ENCOUNTER — Encounter: Payer: Self-pay | Admitting: Physician Assistant

## 2010-10-25 DIAGNOSIS — Z8719 Personal history of other diseases of the digestive system: Secondary | ICD-10-CM | POA: Insufficient documentation

## 2010-10-25 DIAGNOSIS — Z9049 Acquired absence of other specified parts of digestive tract: Secondary | ICD-10-CM | POA: Insufficient documentation

## 2010-10-25 DIAGNOSIS — Z85850 Personal history of malignant neoplasm of thyroid: Secondary | ICD-10-CM | POA: Insufficient documentation

## 2010-10-25 NOTE — Progress Notes (Addendum)
  Subjective:    Patient ID: Michaela Walsh, female    DOB: 1951-06-14, 60 y.o.   MRN: [id number]  HPI Michaela Walsh is a pleasant 60 year old female known to Dr. Lina Sar  who was last seen in August of 2011 at which time she was diagnosed with a gastric ulcer. She had EGD on 02/11/2010 with Dr. Juanda Chance for complaints of acute epigastric pain and this was negative. She then had CT scan of the abdomen and pelvis on 8/25 which showed an 18 mm lesser curve gastric ulcer and diffuse wall thickening of the stomach. She was treated with a course of Prilosec after that and improved. She is status post remote: Not resection for sigmoid diverticulitis. She had a temporary colostomy which was reversed.  At this time she comes back to the office complaining of 2-3 weeks of recurrent upper abdominal pain which is achy  and constant. When she called she was asked to restart on omeprazole. She has not had any nausea or vomiting no fever or chills had had has had some loose stools but no obvious melena or hematochezia. She has been taking Goody powders up to 3-4 daily fairly regularly for headaches over the past couple of months. She currently denies any dysphagia or odynophagia.    Review of Systems  Constitutional: Negative.   HENT: Negative.   Eyes: Negative.   Respiratory: Negative.   Cardiovascular: Negative.   Gastrointestinal: Positive for nausea and abdominal pain.  Genitourinary: Negative.   Musculoskeletal: Negative.   Skin: Negative.   Neurological: Negative.   Hematological: Negative.   Psychiatric/Behavioral: Negative.        Objective:   Physical Exam Well-developed thin white female in no acute distress, pleasant, alert and oriented x3 HEENT; nontraumatic normocephalic EOMI PERRLA sclera anicteric Neck; supple , Cardiovascular; regular rate and rhythm with S1-S2 no murmur rub or gallop  Pulmonary; few scattered rhonchi  Abdomen; soft, bowel sounds active, she is tender in the epigastrium  without guarding or rebound, no palpable mass or hepatosplenomegaly Rectal exam heme-negative, Skin; warm and dry ,benign  Psych; mood and affect normal and appropriate.        Assessment & Plan:  #89 60 year old white female with previous history of gastric ulcer August 2011,  Patient presenting now with recurrent similar upper abdominal pain x2-3 weeks in the setting of regular Goody powder use. Suspect recurrent aspirin-induced ulcer disease.  Plan; Continue omeprazole and increase to 20 mg by mouth twice daily Soft bland diet Discontinue Goody powders and have advised regular Tylenol as needed for headaches Schedule for upper endoscopy with Dr. Juanda Chance Check CBC today  #2 insulin-dependent diabetes mellitus  #3 COPD #4 History of complicated diverticulitis status post sigmoid resection.

## 2010-10-28 ENCOUNTER — Encounter: Payer: Self-pay | Admitting: Internal Medicine

## 2010-10-28 NOTE — Progress Notes (Signed)
Reviewed and agree with management. Cristine Daw D. Tyniya Kuyper, M.D., FACG  

## 2010-10-29 ENCOUNTER — Encounter: Payer: Self-pay | Admitting: Internal Medicine

## 2010-10-29 ENCOUNTER — Ambulatory Visit (AMBULATORY_SURGERY_CENTER): Payer: BC Managed Care – PPO | Admitting: Internal Medicine

## 2010-10-29 DIAGNOSIS — R109 Unspecified abdominal pain: Secondary | ICD-10-CM

## 2010-10-29 DIAGNOSIS — K2940 Chronic atrophic gastritis without bleeding: Secondary | ICD-10-CM

## 2010-10-29 DIAGNOSIS — A048 Other specified bacterial intestinal infections: Secondary | ICD-10-CM

## 2010-10-29 DIAGNOSIS — Z8711 Personal history of peptic ulcer disease: Secondary | ICD-10-CM

## 2010-10-29 DIAGNOSIS — K257 Chronic gastric ulcer without hemorrhage or perforation: Secondary | ICD-10-CM

## 2010-10-29 DIAGNOSIS — R1013 Epigastric pain: Secondary | ICD-10-CM

## 2010-10-29 DIAGNOSIS — K2980 Duodenitis without bleeding: Secondary | ICD-10-CM

## 2010-10-29 DIAGNOSIS — K259 Gastric ulcer, unspecified as acute or chronic, without hemorrhage or perforation: Secondary | ICD-10-CM

## 2010-10-29 LAB — GLUCOSE, CAPILLARY
Glucose-Capillary: 159 mg/dL — ABNORMAL HIGH (ref 70–99)
Glucose-Capillary: 153 mg/dL — ABNORMAL HIGH (ref 70–99)

## 2010-10-29 MED ORDER — SODIUM CHLORIDE 0.9 % IV SOLN: 500.0000 mL | INTRAVENOUS | Status: AC

## 2010-10-29 NOTE — Patient Instructions (Signed)
Ulcer, duodenitis, hiatal hernia Continue Prilosec 20mg  twice a day Avoid any aspirin products for at least 3 weeks.

## 2010-10-30 ENCOUNTER — Telehealth: Payer: Self-pay | Admitting: *Deleted

## 2010-10-30 NOTE — Telephone Encounter (Signed)

## 2010-11-04 ENCOUNTER — Encounter: Payer: Self-pay | Admitting: Internal Medicine

## 2010-11-05 ENCOUNTER — Telehealth: Payer: Self-pay | Admitting: *Deleted

## 2010-11-05 MED ORDER — CLARITHROMYCIN 500 MG PO TABS: ORAL_TABLET | ORAL | Status: DC

## 2010-11-05 MED ORDER — AMOXICILLIN 500 MG PO CAPS: ORAL_CAPSULE | ORAL | Status: DC

## 2010-11-05 NOTE — Assessment & Plan Note (Signed)
[facility]                         GASTROENTEROLOGY OFFICE NOTE   Michaela Walsh, Michaela Walsh                      MRN:          [id number]  DATE:11/24/2006                            DOB:          [birth year]    REFERRING PHYSICIAN:  Guy Sandifer. Henderson Cloud, M.D.   REASON FOR CONSULTATION:  Michaela Walsh is a very nice 60 year old white  female who is here to discuss repeat colonoscopy, at the request of Dr.  Guy Sandifer. Tomblin.  He saw Michaela Walsh in 1999, when she presented with  severe symptomatic sigmoid diverticulosis and subsequent diverticulitis  with a pelvic abscess.  She underwent a sigmoid resection with a  diverting colonoscopy and Hartman's pouch, which was then taken down in  2000, by Dr. Anselm Pancoast. [place].  She developed an anastomotic  stricture at 7 cm from the rectal os, which required dilatations on at  least two occasions.  Since the last dilatation in 2003, she has been  completely well, denying any constipation.  She has been able to  tolerate a regular diet and has not taken any laxatives.  She denies any  rectal bleeding.  She has really never had a full colonoscopic  examination, because initially she was partially obstructed, and  subsequently she only underwent flexible sigmoidoscopies.  There is no  family history of colon cancer.   MEDICATIONS:  1. Humalog sliding scale insulin.  2. Lantus insulin.  3. Calcium and vitamin D.  4. Evista.  5. Fish oil.  6. Aspirin 81 mg p.o. q.d.   PAST MEDICAL HISTORY:  She claims a history of colon polyps, but have  done a full colonoscopic examination on her.   FAMILY HISTORY:  Positive for heart disease in her father.   SOCIAL HISTORY:  Married with four children.  She works as a Public librarian.  She does not smoke.  She does not drink  alcohol.   REVIEW OF SYSTEMS:  Is essentially negative at this time.   PHYSICAL EXAMINATION:  VITAL SIGNS:  Blood pressure 110/56,  pulse 56,  weight 124 pounds.  GENERAL:  She was ambulatory, in no distress.  LUNGS:  Clear to auscultation.  HEART:  Normal S1 and S2.  ABDOMEN:  Soft with decreased muscle tone.  Essentially no muscular  support.  A well-healed surgical scar in the midline.  No ventral  hernia.  Bowel sounds normoactive.  No tenderness at the right subcostal  margin.  RECTAL:  Soft Hemoccult-positive stool.   IMPRESSION:  A 60 year old white female, with a history of severe  diverticula, along with abscess, status post diverting colostomy and  sigmoid resection with takedown of the colostomy in 2000.  She is now  Hemoccult-positive.  She has never had a complete colonoscopy.  We  therefore ought to rule out right-sided colonic lesion.  I notice in her  chart her hemoglobin last fall by Dr. Gregary Signs A. Everardo All was 11.8,  hematocrit 44.9, MCV 90.   PLAN:  I have discussed a colonoscopy with the patient.  She agrees to  proceed using OsmoPrep.  We will reduce her insulin  dose.  I have gone  over the prep, as well as the conscious sedation with her.     Michaela Morton. Juanda Chance, MD  Electronically Signed    DMB/MedQ  DD: 11/24/2006  DT: 11/24/2006  Job #: [id number]   cc:   Georgina Quint. Plotnikov, MD  Guy Sandifer. Henderson Cloud, M.D.

## 2010-11-05 NOTE — H&P (Signed)
NAMEMARC, Michaela [name]  [id number]   MEDICAL RECORD NO.:  [id number]          PATIENT TYPE:  OIB   LOCATION:  5128                         FACILITY:  [facility]   PHYSICIAN:  Hermelinda Medicus, M.D.   DATE OF BIRTH:  02-Mar-1951   DATE OF ADMISSION:  05/31/2008  DATE OF DISCHARGE:                              HISTORY & PHYSICAL   This patient is a 60 year old female who entered my office with a right  parotid tumor.  CAT scan showed this in the inferior aspect of the  parotid, but definitely part of the parotid and it was somewhat mobile  and nontender and she has had this for several months.  Her major other  problems are those of diabetes for which she is on Humalog 5 mg 3 times  a day before meals and Lantus 18 mg daily at bedtime.  The parotid mass  is approximately 2.2 cm in size, is unresponsive to antibiotics and she  will be having this surgically removed as the 23-hour observation.  Her  further history is quite unremarkable.  She smoked 1 pack a day years  ago, but stopped  6 years ago.  She has not had any other history.  She  is on calcium 600 mg and Evista.  She has no allergies to medications.  Her only other surgery is that she has had colonoscopy and partial  colectomy 5 years ago for diverticulitis.  She has no cardiac problems.  No respiratory problems.  She has a partial plate for her teeth and no  hematology difficulties.  Her major issue is that of insulin-dependent  diabetes.  Her blood pressure 110/70.  The ears are clear.  Oral cavity  is clear.  Her CBG when she came in this morning was 258 and now  postoperatively is 137.  Weight is 118.  Her ears are clear.  Tympanic  membranes are free of any inflammation or lack of mobility.  The nose is  completely clear.  There is no evidence of any infection, sinusitis, or  postnasal drainage.  Larynx and pharynx are clear of any ulceration or  mass.  True cord mobility, gag reflex, tongue mobility, EOMs,  and facial  nerve are all symmetrical.  The parotid mass is approximately 2.2 cm, is  mobile and nontender on the right.  Neck is otherwise unremarkable.  Thyroid is unremarkable.  Chest is clear.  No rales, rhonchi, or  wheezes.  Cardiovascular reveals no rubs, murmurs, or gallops.  Abdomen  is unremarkable.  Extremities are unremarkable.   Initial diagnosis is that of right parotid mass; probable tumor, rule  out lymphoma; diabetic, insulin dependent, and history of colectomy and  colonoscopy for diverticulosis.   Our plan is to do a superficial parotidectomy on the right.           ______________________________  Hermelinda Medicus, M.D.     JC/MEDQ  D:  05/31/2008  T:  05/31/2008  Job:  [id number]   cc:   Gregary Signs A. Everardo All, MD  Georgina Quint Plotnikov, MD

## 2010-11-05 NOTE — Telephone Encounter (Signed)
Spoke with patient and sent rx to pharmacy. 

## 2010-11-05 NOTE — Op Note (Signed)
NAMEANNEBELLE, Michaela [name]  [id number]   MEDICAL RECORD NO.:  [id number]          PATIENT TYPE:  OIB   LOCATION:  5128                         FACILITY:  [facility]   PHYSICIAN:  Hermelinda Medicus, M.D.   DATE OF BIRTH:  1951/05/18   DATE OF PROCEDURE:  05/31/2008  DATE OF DISCHARGE:                               OPERATIVE REPORT   PREOPERATIVE DIAGNOSIS:  Right parotid tumor.   POSTOPERATIVE DIAGNOSIS:  Right parotid tumor.   OPERATION:  Right superficial parotidectomy with facial nerve  dissection.   OPERATOR:  Hermelinda Medicus, MD   ASSISTANT:  Kristine Garbe. Ezzard Standing, MD   ANESTHESIA:  General endotracheal with Dr. Ivin Booty.   The patient is aware of the risks and gains, aware of the need for  removal of this mass as that is a tumor, that could be a lymphoma but  most likely is a parotid tumor and is increasing in size.  She is also  aware of the risk of a facial nerve weakness postoperatively and is  aware that she will have a drain in her neck overnight, and she will  have a small scar in the preauricular region.   PROCEDURE:  The patient placed in the supine position.  Under general  endotracheal anesthesia, the right side was exposed by turning the head  to the left, and she was prepped and draped in the appropriate manner  using Betadine in the usual head and neck head drape.  A preauricular  incision was carried then back behind the earlobe, behind the mastoid  and down under the neck, and the flaps were elevated posteriorly and  superiorly over the superficial parotid.  The mass was found to be quite  large and deep at least 2 x 2 cm and in the small lady the dissection  was continued, dissecting down on the parotid, and using the tympanic  portion of the external ear canal within the styloid process, we found  the facial nerve, and then the branches of the facial nerve dissected  this mass off the facial nerve.  This appears small on CAT scan but was  actually bifurcating the nerve more extensively than its usual anatomy  would be.  We, however, saved all branches and none of the branches were  going directly into the tumor and margins were very clear.  As we  carried out the dissection, we used Bovie electrocoagulation and 4-0  silk suture for hemostasis, and then we used the bipolar cautery set at  20.  All hemostasis was then established, once the mass and the  superficial lobe of the parotid was removed.  All branches of the nerve  were intact.  They were tested by nerve stimulator as well, and then a  small and Jackson-Pratt drain was placed #7 around.  Once, this was  sutured in place and the closure was with 3-0 chromic catgut and then 5-  0 Ethilon and the skin.  Steri-Strips were applied.  The  patient was taken to the recovery room in good condition.  Her blood  glucose CBG was 137, and she  is doing very well.  She will be kept  overnight because of her diabetic status and he is to return and see me  in 1 week, 3 weeks, 6 weeks, 3 months, and 6 months.           ______________________________  Hermelinda Medicus, M.D.     JC/MEDQ  D:  05/31/2008  T:  05/31/2008  Job:  [id number]   cc:   Georgina Quint. Plotnikov, MD  Sean A. Everardo All, MD  Kristine Garbe Ezzard Standing, M.D.

## 2010-11-05 NOTE — Assessment & Plan Note (Signed)
[facility]                                 ON-CALL NOTE   ARLO, BUFFONE                      MRN:          [id number]  DATE:12/18/2008                            DOB:          07/20/1950    DATE AND TIME:  On December 18, 2008, at 6:15 p.m.   PHONE NUMBER:  [phone].   Dr. Everardo All is regular doctor   CHIEF COMPLAINT:  Out of Lantus insulin.   CALLER:  Hulen Shouts, her daughter.   The patient's daughter says she has been out of Lantus insulin.  Her  blood sugar has been very high.  It was over 500 this morning.  They  called in and got an appointment with Dr. Everardo All tomorrow at 3:30.  She  needs a dose called in tonight to get her through the night.  She is  feeling okay now, but her sugar has been high in general.  I called in  Lantus SoloSTAR insulin to dose 18 units at bedtime 2 doses with no  refills to the [facility] at [phone].  Advised them  to call back if they have any questions or problems, otherwise to follow  up with Dr. Everardo All tomorrow as planned.     Marne A. Tower, MD  Electronically Signed    MAT/MedQ  DD: 12/18/2008  DT: 12/19/2008  Job #: [id number]

## 2010-11-05 NOTE — Telephone Encounter (Signed)
Message copied by Jesse Fall on Tue Nov 05, 2010  9:48 AM ------      Message from: Lina Sar      Created: Mon Nov 04, 2010 10:56 PM       Rene Kocher, this pt will be calling concerning H.Pylori treatment. She needs to take her PPI bid, Bioxin 500mg  po bidx 10 days an Amoxacillin 1000mg  1 po bid x 10 days thanx

## 2010-11-08 NOTE — Procedures (Signed)
[facility]  Patient:    Michaela Walsh, VOKES Visit Number: [id number] MRN: [id number]          Service Type: END Location: ENDO Attending Physician:  Mervin Hack Dictated by:   Hedwig Morton. Juanda Chance, M.D. [facility] Admit Date:  11/03/2001   CC:         Claire Shown. Zachery Dakins, M.D.   Procedure Report  PROCEDURE:  Dilatation of sigmoid stricture.  INDICATION:  This 60 year old white female has had an anastomotic stricture in the sigmoid colon at the level of 7 cm from the rectum.  This was dilated on September 13, 2001, after she presented to emergency room with partial sigmoid obstruction.  Patient has previous history of diverticulosis, diverticulitis, status post sigmoid resection with primary anastomosis.  Since the last dilatation she has remained asymptomatic, taking stool softeners on a daily basis.  She is undergoing repeat sigmoidoscopy to assess the success of the dilatation and to proceed with another dilatation.  The last time the dilators used were 14 and 15 Savary.  ENDOSCOPE:  Olympus single-channel video scope.  SEDATION:  Versed 7.5 mg IV, Demerol 70 mg IV.  FINDINGS:  Olympus single-channel pediatric upper endoscope was passed under direct vision through the rectum to sigmoid colon.  The sigmoid anastomosis was located at 7 cm from the anal verge, and it allowed at this time the endoscope to traverse freely through the stricture.  The diameter of the stricture was at least 14-15 mm.  There were no acute inflammatory changes of the stricture.  There were numerous diverticula proximal to the stricture. Endoscope passed all the way up to 35 cm from the rectum.  Guidewire was placed through the endoscope.  Endoscope was then retracted and Savary dilators were passed over the guidewire blindly using 15, 16, 17, and  18 mm dilators.  There was a small amount of blood on the last two dilators.  The patient tolerated the procedure  well.  IMPRESSION: 1. Benign anastomotic stricture of the sigmoid colon, status post dilatation    to 18 mm. 2. Diverticulosis of the left colon.  PLAN: 1. Resume previous diet. 2. Continue stool softeners on a daily basis. 3. Reassess in six months. Dictated by:   Hedwig Morton. Juanda Chance, M.D. [facility] Attending Physician:  Mervin Hack DD:  11/03/01 TD:  11/04/01 Job: [id number]

## 2010-11-08 NOTE — H&P (Signed)
[place]  Patient:    Michaela Walsh, Michaela Walsh Visit Number: [id number] MRN: [id number]          Service Type: EMS Location: [place] Attending Physician:  Armanda Heritage Dictated by:   Sandria Bales. Ezzard Standing, M.D. Admit Date:  09/11/2001   CC:         [facility], [place]  Anselm Pancoast. Zachery Dakins, M.D.  Hedwig Morton. Juanda Chance, M.D. [facility]   History and Physical  DATE OF BIRTH:  Nov 07, 1950  HISTORY OF PRESENT ILLNESS:  This is a 60 year old white female, who had a reversal of a colostomy by Dr. Zachery Dakins in February 2000.  He did use a 29 EEA by his dictated note.  She has not seen Dr. Zachery Dakins or Dr. Juanda Chance since this surgery in February 2000.  Over the last several months, she has had vague abdominal and back pain which has gotten worse.  Over the last week, her abdominal pain has even gotten worse, but she has not talked to any physician, either her primary care physicians nor Drs. Brodie or [employer].  I think she had severe pain last night, presented to the [facility] Emergency Room this morning for further evaluation.  She denies any known of peptic ulcer disease, liver disease, pancreatic disease.  PAST MEDICAL HISTORY:  She has no allergies.  MEDICATIONS:  Calcium, vitamin, and Femhrt.  REVIEW OF SYSTEMS:  PULMONARY:  No history of pneumonia or tuberculosis. CARDIAC:  She has had no chest pain or hypertension.  GASTROINTESTINAL:  No history of peptic ulcer disease or liver disease.  She did have a colonoscopy, she thinks, before her reversal of her colon, but she has not seen Dr. Juanda Chance since that time.  UROLOGIC:  No history of kidney stones or kidney infections.  PHYSICAL EXAMINATION:  VITAL SIGNS:  Her temperature is 97.6 with blood pressure 119/58.  Her pulse is 85; respirations are 20.  GENERAL:  She is a well-nourished, white female.  She has been given some pain medicines since she has been in here, but she actually looks  pretty good just in looking at her.  HEENT:  Unremarkable.  NECK:  Supple without mass, without thyromegaly.  LUNGS:  Clear to auscultation.  HEART:  Regular rate and rhythm.  ABDOMEN:  Actually fairly benign.  It does not feel distended.  She does have a very small hernia in the interior aspect of her incision which may be about 1-2 cm in size.  RECTAL:  She has up at about, I would guesstimate, about 6-7 cm, a very tight stricture which cannot be much more than 1 cm in diameter at what I think is her sigmoid to rectum anastomosis performed by Dr. Zachery Dakins.  This was fairly uncomfortable examining her, and I had the impression she had stool above this area.  EXTREMITIES:  She has full strength in all four extremities.  NEUROLOGIC:  Grossly intact.  LABORATORY DATA:  Her white blood cell count is 14,900, hemoglobin 12.9, hematocrit 37.3.  Her sodium is 140, potassium 3.6, chloride 112.  I reviewed her CT scan with Mark ______.  She does appear to have some thickening of her left colon.  She also has what may be a narrowing of her sigmoid or rectal junction at the stapled anastomosis, and then she has a little bit of maybe inflammatory changes posterior to this.  IMPRESSION:  It is my impression that Ms. Purk has a probable stricture of her rectosigmoid anastomosis that  has caused progressive thickening and hypertrophy of her left bowel, and this has caused her vague back pain which has gotten steadily worse as she has become more obstructed.  PLAN:  I spoke with Dr. Juanda Chance.  We are going to try to figure out how to arrange getting her bowel cleaned out so she can undergo a diagnostic colonoscopy and possible dilatation. Dictated by:   Sandria Bales. Ezzard Standing, M.D. Attending Physician:  Armanda Heritage DD:  09/11/01 TD:  09/11/01 Job: [id number]

## 2010-11-08 NOTE — Procedures (Signed)
[facility]  Patient:    Michaela Walsh, Michaela Walsh Visit Number: [id number] MRN: [id number]          Service Type: END Location: ENDO Attending Physician:  Mervin Hack Dictated by:   Hedwig Morton. Juanda Chance, M.D. [facility] Admit Date:  09/13/2001   CC:         Anselm Pancoast. Zachery Dakins, M.D.   Procedure Report  PROCEDURE:  Flexible sigmoidoscopy and dilatation of anastomotic stricture.  ENDOSCOPIST:  Hedwig Morton. Juanda Chance, M.D.  INDICATIONS:  This 60 year old white female has presented to emergency room 2 days ago with acute left lower quadrant abdominal pain as well as suprapubic abdominal pain, associated with constipation.  She has a history of sigmoid resection for diverticulitis with perforation.  On CT scan of the abdomen and pelvis it became apparent that at the level of the rectosigmoid the colon developed a benign anastomotic stricture.  On digital examination this confirmed.  At tip of my finger there was a stricture which did not allow the entire finger to pass through it.  The patient has been dysimpacted over the weekend using GoLYTELY as well as other oral laxatives and a fleets enema and is now undergoing flexible sigmoidoscopy with assessment and evaluation of the anastomotic stricture and ______ dilatation.  ENDOSCOPE:  Olympus single channel videoscope.  SEDATION:  Versed 7 mg IV, Demerol 60 mg IV.  DESCRIPTION OF PROCEDURE:  The Olympus single channel videoscope was passed under direct vision to the sigmoid colon.  The patient was monitored by pulse oximeter.  Her oxygen saturation were satisfactory.   Anal canal and rectal ampulla was normal.  At the level of about 7 to 8 cm from the rectum there was a tight anastomotic stricture which initially did not allow the colonoscope to pass and transverse through into the colon.  We had to withdraw the colonoscope and use the pediatric upper endoscope which was admitted through the stricture with mild  pressure.  The diameter of the stricture was about 8 to 9 mm.  There were several ulcerations at the circumference of the stricture. Proximal to the stricture in the sigmoid colon were numerous diverticula, colonoscope was examined up to the level of 45 cm or distal to the splenic flexure.  At that point endoscope was slowly retracted.  Guidewire was placed through the upper endoscope into the sigmoid colon, endoscope was retracted and several dilators passed over the guidewire through the stricture.  The patient had a mild to moderate amount of discomfort with the dilatation using 14 mm, 15 mm.  The patient was re-endoscoped after the first dilatations to confirm dilatation of the stricture and no evidence of mucosal injury to the rest of the colon.  Guidewire was then reinserted and 2 more dilators were used to dilate the stricture using 16 and 77 mm dilators.  Again, guidewire was retracted and colon was re-endoscoped to confirm dilatation of the strictu and absence of any injury to the surrounding mucosa.  Next, the guidewire was placed again up to the level of 30 cm, and 3 more dilators were used, 18, 19 and 20 mm, several dilators passed over the guidewire without fluoroscopic guidance.  The stricture was rather tight and required some gentle pressure to be able to pass the largest dilator through. Re-endoscopy confirmed some bleeding from the dilated stricture but no major tears.  The patient tolerated the procedure well.  IMPRESSION: 1. Rectosigmoid stricture at the anastomosis at the level of 7 cm., from the  anal verge. 2. Diverticulosis of the left colon. 3. Dilatation of the rectosigmoid stricture to 20 mm.  PLAN: 1. Resume previous diet. 2. Stool softeners on a daily basis. 3. Re-endoscope patient in 2-3 months to assess the healing of the stricture    and the surrounding ulcerations and to assess if further dilatation is    needed at this point. Dictated by:    Hedwig Morton. Juanda Chance, M.D. [facility] Attending Physician:  Mervin Hack DD:  09/13/01 TD:  09/14/01 Job: [id number] [id number]

## 2010-11-08 NOTE — Discharge Summary (Signed)
[place]  Patient:    Michaela Walsh, Michaela Walsh Visit Number: [id number] MRN: [id number]          Service Type: EMS Location: [place] Attending Physician:  Armanda Heritage Dictated by:   Sandria Bales. Ezzard Standing, M.D. Admit Date:  09/11/2001   CC:         Hedwig Morton. Juanda Chance, M.D. [facility]  Anselm Pancoast. Zachery Dakins, M.D.   Discharge Summary  NO DICTATION Dictated by:   Sandria Bales. Ezzard Standing, M.D. Attending Physician:  Armanda Heritage DD:  09/11/01 TD:  09/11/01 Job: [id number]

## 2010-11-08 NOTE — Assessment & Plan Note (Signed)
[facility]                             PRIMARY CARE OFFICE NOTE   YITTEL, EMRICH                      MRN:          [id number]  DATE:03/09/2006                            DOB:          10-11-1950    The patient is a 60 year old female who presents for a wellness examination.   ALLERGIES:  None.   PAST MEDICAL HISTORY, FAMILY HISTORY, SOCIAL HISTORY:  As per July 17, 2004, note.   MEDICATIONS:  Medicines are reviewed with the patient.   REVIEW OF SYSTEMS:  No chest pain or shortness of breath.  No syncope.  No  neurologic complaints.  Occasional short-term vertigo symptoms lately.  No  syncope or chest pain.  Blood sugar has not been very well controlled.  The  rest is negative.   PHYSICAL EXAMINATION:  VITAL SIGNS: Blood pressure 96/49.  Pulse 65.  Temperature 98.2.  Weight 121 pounds.  GENERAL:  She is in no acute distress.  HEENT:  Moist mucosa.  NECK:  Supple.  No thyromegaly.  Possible right carotid bruit.  LUNGS:  Clear.  No wheezes or rales.  CARDIOVASCULAR:  S1, S2.  No murmur.  No gallop.  ABDOMEN:  Soft, nontender without hepatomegaly or mass felt.  EXTREMITIES:  Lower extremities without edema.  NEURO:  She is alert and cooperative.  Denies being depressed.  No pronator  drift.  Cranial nerves are nonfocal, normal.  Deep tendon reflexes, muscle  strength nonfocal, normal  SKIN:  Clear with aging changes.   LABS:  On January 29, 2006, A1C of  9.1%.   ASSESSMENT AND PLAN:  1. Normal well examination.  Age/health related issues discussed, healthy      lifestyle discussed.  We will obtain a chest x-ray today.  Gynecologic      care by [facility] with Pap smear every year.  Mammogram yearly.  We will      get a colonoscopy next year.  (She had a sigmoidoscopy in 1993 and      colonoscopy in 1999.)  2. Vertigo, likely benign positional vertigo.  Head, eyes, ears, nose, and      throat exam was normal.  She will start  Brandt-Daroff exercise.  Obtain      a carotid ultrasound in the view of a possible right bruit.  Head CT if      not better.  3. Type 2 diabetes, suboptimal control.  She has been seeing Dr. Everardo All      on a monthly basis.  4. I will see her back in 3 months.                                   Georgina Quint. Plotnikov, MD   AVP/MedQ  DD:  03/10/2006  DT:  03/10/2006  Job #:  [id number]   cc:   Guy Sandifer. Henderson Cloud, M.D.  Sean A. Everardo All, MD

## 2011-03-27 LAB — BASIC METABOLIC PANEL
Calcium: 9.3 mg/dL (ref 8.4–10.5)
Creatinine, Ser: 0.81 mg/dL (ref 0.4–1.2)
GFR calc Af Amer: >60 mL/min (ref >60)
GFR calc non Af Amer: >60 mL/min (ref >60)
Sodium: 135 mEq/L (ref 135–145)

## 2011-03-27 LAB — BASIC METABOLIC PANEL WITH GFR
BUN: 9 mg/dL (ref 6–23)
CO2: 25 meq/L (ref 19–32)
Chloride: 103 meq/L (ref 96–112)
Glucose, Bld: 425 mg/dL — ABNORMAL HIGH (ref 70–99)
Potassium: 5.1 meq/L (ref 3.5–5.1)

## 2011-03-27 LAB — URINALYSIS, ROUTINE W REFLEX MICROSCOPIC
Bilirubin Urine: NEGATIVE
Glucose, UA: >1000 mg/dL — AB
Hgb urine dipstick: NEGATIVE
Ketones, ur: NEGATIVE mg/dL
Leukocytes, UA: NEGATIVE
Nitrite: NEGATIVE
Protein, ur: NEGATIVE mg/dL
Specific Gravity, Urine: 1.016 (ref 1.005–1.030)
Urobilinogen, UA: 0.2 mg/dL (ref 0.0–1.0)
pH: 6.5 (ref 5.0–8.0)

## 2011-03-27 LAB — CBC
HCT: 37.3 % (ref 36.0–46.0)
Hemoglobin: 12.7 g/dL (ref 12.0–15.0)
MCHC: 34.1 g/dL (ref 30.0–36.0)
MCV: 90.9 fL (ref 78.0–100.0)
Platelets: 343 K/uL (ref 150–400)
RBC: 4.1 MIL/uL (ref 3.87–5.11)
RDW: 14.4 % (ref 11.5–15.5)
WBC: 7.5 10*3/uL (ref 4.0–10.5)

## 2011-03-27 LAB — GLUCOSE, CAPILLARY
Glucose-Capillary: 164 mg/dL — ABNORMAL HIGH (ref 70–99)
Glucose-Capillary: 175 mg/dL — ABNORMAL HIGH (ref 70–99)
Glucose-Capillary: 258 mg/dL — ABNORMAL HIGH (ref 70–99)
Glucose-Capillary: 275 mg/dL — ABNORMAL HIGH (ref 70–99)
Glucose-Capillary: 303 mg/dL — ABNORMAL HIGH (ref 70–99)

## 2011-03-27 LAB — URINE MICROSCOPIC-ADD ON

## 2011-04-29 ENCOUNTER — Other Ambulatory Visit: Payer: Self-pay | Admitting: Obstetrics and Gynecology

## 2011-04-29 DIAGNOSIS — R928 Other abnormal and inconclusive findings on diagnostic imaging of breast: Secondary | ICD-10-CM

## 2011-04-30 ENCOUNTER — Other Ambulatory Visit: Payer: Self-pay | Admitting: Gastroenterology

## 2011-05-14 ENCOUNTER — Ambulatory Visit
Admission: RE | Admit: 2011-05-14 | Discharge: 2011-05-14 | Disposition: A | Discharge status: Home / Self Care | Payer: BC Managed Care – PPO | Source: Ambulatory Visit | Attending: Obstetrics and Gynecology | Admitting: Obstetrics and Gynecology

## 2011-05-14 DIAGNOSIS — R928 Other abnormal and inconclusive findings on diagnostic imaging of breast: Secondary | ICD-10-CM

## 2011-09-24 ENCOUNTER — Other Ambulatory Visit: Payer: Self-pay | Admitting: Internal Medicine

## 2014-05-12 ENCOUNTER — Other Ambulatory Visit: Payer: Self-pay | Admitting: Obstetrics and Gynecology

## 2014-05-16 LAB — CYTOLOGY - PAP

## 2014-11-13 ENCOUNTER — Encounter: Payer: Self-pay | Admitting: Internal Medicine

## 2015-11-29 ENCOUNTER — Encounter: Payer: Self-pay | Admitting: Gastroenterology

## 2015-11-29 ENCOUNTER — Ambulatory Visit (INDEPENDENT_AMBULATORY_CARE_PROVIDER_SITE_OTHER): Payer: 59 | Admitting: Gastroenterology

## 2015-11-29 VITALS — BP 100/60 | HR 80 | Ht 60.5 in | Wt 103.2 lb

## 2015-11-29 DIAGNOSIS — R197 Diarrhea, unspecified: Secondary | ICD-10-CM | POA: Diagnosis not present

## 2015-11-29 DIAGNOSIS — R159 Full incontinence of feces: Secondary | ICD-10-CM

## 2015-11-29 DIAGNOSIS — Z8719 Personal history of other diseases of the digestive system: Secondary | ICD-10-CM

## 2015-11-29 DIAGNOSIS — R634 Abnormal weight loss: Secondary | ICD-10-CM

## 2015-11-29 DIAGNOSIS — Z8711 Personal history of peptic ulcer disease: Secondary | ICD-10-CM

## 2015-11-29 NOTE — Patient Instructions (Signed)

## 2015-11-29 NOTE — Progress Notes (Signed)
Michaela Walsh    [id number]    06/18/1951  Primary Care Physician:KOHUT,WALTER Simona Huh, MD  Referring Physician: Anda Kraft, MD [address] [place], [place] [address]  Chief complaint: Diarrhea, fecal incontinence   HPI: 65 year old female previously followed by Dr. Olevia Perches, last visit in May 2012 here with complaints of diarrhea and fecal incontinence for at least past 1-2 years, progressively worse. She has incontinence to flatus, liquid and also formed stool. She also reports weight loss in the past couple years. Her last EGD in May 2012 showed about a 1 cm gastric ulcer and last colonoscopy in 2008 showed evidence of segmental colectomy of sigmoid and left-sided diverticulosis. She reports her appetite is normal . Denies any nausea, vomiting, abdominal pain, melena or bright red blood per rectum  Outpatient Encounter Prescriptions as of 11/29/2015  Medication Sig  . Calcium Carbonate-Vitamin D (CALCIUM + D) 600-200 MG-UNIT TABS Take by mouth.    . Cholecalciferol (VITAMIN D-3 PO) Take 1 tablet by mouth 2 (two) times daily.  . empagliflozin (JARDIANCE) 25 MG TABS tablet Take 25 mg by mouth daily.  . ferrous sulfate 325 (65 FE) MG tablet Take 325 mg by mouth 2 (two) times daily with a meal.    . glucose blood test strip 1 each by Other route as needed. Use as instructed   . hydrochlorothiazide (HYDRODIURIL) 50 MG tablet Take 50 mg by mouth daily.  . insulin detemir (LEVEMIR) 100 UNIT/ML injection Inject into the skin at bedtime.  . insulin lispro (HUMALOG KWIKPEN) 100 UNIT/ML injection Inject 5 Units into the skin 3 (three) times daily before meals.    . Multiple Vitamin (MULTIVITAMIN) tablet Take 1 tablet by mouth daily.    Marland Kitchen omeprazole (PRILOSEC) 20 MG capsule TAKE 1 CAPSULE BY MOUTH TWICE DAILY  . pioglitazone (ACTOS) 15 MG tablet Take 15 mg by mouth daily.  . pravastatin (PRAVACHOL) 80 MG tablet Take 80 mg by mouth daily.  . ramipril (ALTACE) 2.5 MG  capsule Take 2.5 mg by mouth daily.  . Saxagliptin-Metformin (KOMBIGLYZE XR) 2.10-998 MG TB24 Take 1 tablet by mouth 2 (two) times daily.    . [DISCONTINUED] amoxicillin (AMOXIL) 500 MG capsule Take 2 capsules po BID x 10 days  . [DISCONTINUED] clarithromycin (BIAXIN) 500 MG tablet Take one po BID for 10 days  . [DISCONTINUED] insulin glargine (LANTUS SOLOSTAR) 100 UNIT/ML injection Inject 18 Units into the skin at bedtime.    . [DISCONTINUED] raloxifene (EVISTA) 60 MG tablet Take 60 mg by mouth daily.    . [DISCONTINUED] traMADol (ULTRAM) 50 MG tablet Take 1 tablet by mouth every 6 hours as needed   Facility-Administered Encounter Medications as of 11/29/2015  Medication  . 0.9 %  sodium chloride infusion    Allergies as of 11/29/2015  . (No Known Allergies)    Past Medical History  Diagnosis Date  . Anorexia     Loss of appetite  . Paralysis of vocal cords or larynx, unspecified   . Other emphysema ([place])   . Carcinoma ([place])     Skin, squamous cell (face)  . Hyperthyroidism   . Digestive-genital tract fistula, female   . Osteoporosis, unspecified   . Tobacco use disorder   . Personal history of colonic polyps   . Stenosis of rectum and anus   . Diverticulitis of colon (without mention of hemorrhage) 562.10  . Diabetes mellitus   . GERD (gastroesophageal reflux disease)   .  Ulcer     Past Surgical History  Procedure Laterality Date  . Colostomy    . Sigmoid resection / rectopexy      with take down of colostomy  . Dilation and curettage of uterus      x2  . Cesarean section    . Tubal ligation      with subsequent tubal pregnancy (redo)  . Wrist surgery    . Parotid gland resection    . Colonoscopy    . Polypectomy      Family History  Problem Relation Age of Onset  . Heart disease Father   . Colon cancer Neg Hx   . Colon polyps Daughter   . Seizures Mother     Social History   Social History  . Marital Status: Married    Spouse Name: N/A  . Number of  Children: 4  . Years of Education: N/A   Occupational History  . Customer Service    Social History Main Topics  . Smoking status: Former Research scientist (life sciences)  . Smokeless tobacco: Never Used  . Alcohol Use: No  . Drug Use: No  . Sexual Activity: Not on file   Other Topics Concern  . Not on file   Social History Narrative      Review of systems: Review of Systems  Constitutional: Negative for fever and chills.  HENT: Negative.   Eyes: Negative for blurred vision.  Respiratory: Negative for cough, shortness of breath and wheezing.   Cardiovascular: Negative for chest pain and palpitations.  Gastrointestinal: as per HPI Genitourinary: Negative for dysuria, urgency, frequency and hematuria.  Musculoskeletal: Negative for myalgias, back pain and joint pain.  Skin: Negative for itching and rash.  Neurological: Negative for dizziness, tremors, focal weakness, seizures and loss of consciousness.  Endo/Heme/Allergies: Negative for environmental allergies.  Psychiatric/Behavioral: Negative for depression, suicidal ideas and hallucinations.  All other systems reviewed and are negative.   Physical Exam: Filed Vitals:   11/29/15 0940  BP: 100/60  Pulse: 80   Gen:      No acute distress, petite  HEENT:  EOMI, sclera anicteric Neck:     No masses; no thyromegaly Lungs:    Clear to auscultation bilaterally; normal respiratory effort CV:         Regular rate and rhythm; no murmurs Abd:      + bowel sounds; soft, non-tender; no palpable masses, no distension Ext:    No edema; adequate peripheral perfusion Skin:      Warm and dry; no rash Neuro: alert and oriented x 3 Psych: normal mood and affect Rectal exam: Decreased anal sphincter tone, no anal fissure or external hemorrhoids  Data Reviewed: Reviewed chart in epic  Assessment and Plan/Recommendations: 65 year old female with history of gastric ulcer, positive H. pylori here with complaints of diarrhea and fecal incontinence. Patient  was given treatment for H. pylori eradication, she doesn't recall the treatment course or if she was tested subsequently to confirm eradication She has been progressively losing weight in past 2 years We'll schedule for EGD and colonoscopy for evaluation of diarrhea and also weight loss Advised patient to do kegel exercises to strengthen anal sphincter Will consider anorectal manometry and pelvic floor physical therapy after colonoscopy if continues to have persistent symptoms of fecal incontinence  K. Denzil Magnuson , MD [phone] Mon-Fri 8a-5p [phone] after 5p, weekends, holidays  CC: Anda Kraft, MD

## 2015-12-24 ENCOUNTER — Encounter: Payer: Self-pay | Admitting: Gastroenterology

## 2015-12-28 ENCOUNTER — Telehealth: Payer: Self-pay | Admitting: Gastroenterology

## 2015-12-28 MED ORDER — NA SULFATE-K SULFATE-MG SULF 17.5-3.13-1.6 GM/177ML PO SOLN: 1.0000 | Freq: Once | ORAL | Status: DC

## 2015-12-28 NOTE — Telephone Encounter (Signed)
Sent in prep kit for patient- She has prep instructions and did not need me to go over them with her. Told her to contact the office if she has any questions

## 2016-01-01 ENCOUNTER — Encounter: Payer: Self-pay | Admitting: Gastroenterology

## 2016-01-01 ENCOUNTER — Ambulatory Visit (AMBULATORY_SURGERY_CENTER): Payer: 59 | Admitting: Gastroenterology

## 2016-01-01 VITALS — BP 112/66 | HR 63 | Temp 97.8°F | Resp 13 | Ht 61.5 in | Wt 103.0 lb

## 2016-01-01 DIAGNOSIS — K2970 Gastritis, unspecified, without bleeding: Secondary | ICD-10-CM

## 2016-01-01 DIAGNOSIS — Z8711 Personal history of peptic ulcer disease: Secondary | ICD-10-CM

## 2016-01-01 DIAGNOSIS — K621 Rectal polyp: Secondary | ICD-10-CM

## 2016-01-01 DIAGNOSIS — R197 Diarrhea, unspecified: Secondary | ICD-10-CM | POA: Diagnosis not present

## 2016-01-01 DIAGNOSIS — D128 Benign neoplasm of rectum: Secondary | ICD-10-CM

## 2016-01-01 DIAGNOSIS — Z8719 Personal history of other diseases of the digestive system: Secondary | ICD-10-CM

## 2016-01-01 LAB — GLUCOSE, CAPILLARY
GLUCOSE-CAPILLARY: 143 mg/dL — AB (ref 65–99)
GLUCOSE-CAPILLARY: 148 mg/dL — AB (ref 65–99)

## 2016-01-01 MED ORDER — SODIUM CHLORIDE 0.9 % IV SOLN: 500.0000 mL | INTRAVENOUS | Status: DC

## 2016-01-01 NOTE — Patient Instructions (Signed)

## 2016-01-01 NOTE — Progress Notes (Signed)
Called to room to assist during endoscopic procedure.  Patient ID and intended procedure confirmed with present staff. Received instructions for my participation in the procedure from the performing physician.  

## 2016-01-01 NOTE — Op Note (Signed)
[place] Patient Name: Michaela Walsh Procedure Date: 01/01/2016 8:56 AM MRN: [id number] Endoscopist: Mauri Pole , MD Age: 65 Referring MD:  Date of Birth: 1950/09/12 Gender: Female Account #: [id number] Procedure:                Colonoscopy Indications:              Clinically significant diarrhea of unexplained                            origin Medicines:                Monitored Anesthesia Care Procedure:                Pre-Anesthesia Assessment:                           - Prior to the procedure, a History and Physical                            was performed, and patient medications and                            allergies were reviewed. The patient's tolerance of                            previous anesthesia was also reviewed. The risks                            and benefits of the procedure and the sedation                            options and risks were discussed with the patient.                            All questions were answered, and informed consent                            was obtained. Prior Anticoagulants: The patient has                            taken no previous anticoagulant or antiplatelet                            agents. ASA Grade Assessment: III - A patient with                            severe systemic disease. After reviewing the risks                            and benefits, the patient was deemed in                            satisfactory condition to undergo the procedure.  After obtaining informed consent, the colonoscope                            was passed under direct vision. Throughout the                            procedure, the patient's blood pressure, pulse, and                            oxygen saturations were monitored continuously. The                            Model CF-HQ190L [id number]) scope was introduced                            through the anus and advanced to the the                       ileocolonic anastomosis. The colonoscopy was                            performed without difficulty. The patient tolerated                            the procedure well. The quality of the bowel                            preparation was good. Ileo colonic anastamosis and                            appendicial orifice were photographed. Scope In: 9:14:17 AM Scope Out: 9:30:11 AM Scope Withdrawal Time: 0 hours 13 minutes 40 seconds  Total Procedure Duration: 0 hours 15 minutes 54 seconds  Findings:                 Weak anal sphincter                           A 3 mm polyp was found in the rectum. The polyp was                            sessile. The polyp was removed with a cold snare.                            Resection and retrieval were complete.                           Multiple small and large-mouthed diverticula were                            found in the entire colon.                           Biopsies for histology were taken with a cold  forceps from the entire colon for evaluation of                            microscopic colitis. Complications:            No immediate complications. Estimated Blood Loss:     Estimated blood loss was minimal. Impression:               - One 3 mm polyp in the rectum, removed with a cold                            snare. Resected and retrieved.                           - Diverticulosis in the entire examined colon.                           - Biopsies were taken with a cold forceps from the                            entire colon for evaluation of microscopic colitis. Recommendation:           - Patient has a contact number available for                            emergencies. The signs and symptoms of potential                            delayed complications were discussed with the                            patient. Return to normal activities tomorrow.                            Written  discharge instructions were provided to the                            patient.                           - Resume previous diet.                           - Continue present medications.                           - Await pathology results.                           - Repeat colonoscopy is recommended for                            surveillance. The colonoscopy date will be                            determined after pathology results from today's  exam become available for review.                           - Return to GI clinic PRN.                           -Start VSL#3 112 billion units, 1 capsule daily                            (OTC) Mauri Pole, MD 01/01/2016 9:49:40 AM This report has been signed electronically.

## 2016-01-01 NOTE — Op Note (Signed)
[facility] Patient Name: Michaela Walsh Procedure Date: 01/01/2016 8:57 AM MRN: [id number] Endoscopist: Mauri Pole , MD Age: 65 Referring MD:  Date of Birth: 11/08/1950 Gender: Female Account #: [id number] Procedure:                Upper GI endoscopy Indications:              Diagnostic sampling is indicated, Upper abdominal                            symptoms associated with anorexia (suggesting                            structural disease), Upper abdominal symptoms                            associated with weight loss (suggesting structural                            disease) Medicines:                Monitored Anesthesia Care Procedure:                Pre-Anesthesia Assessment:                           - Prior to the procedure, a History and Physical                            was performed, and patient medications and                            allergies were reviewed. The patient's tolerance of                            previous anesthesia was also reviewed. The risks                            and benefits of the procedure and the sedation                            options and risks were discussed with the patient.                            All questions were answered, and informed consent                            was obtained. Prior Anticoagulants: The patient has                            taken no previous anticoagulant or antiplatelet                            agents. ASA Grade Assessment: III - A patient with  severe systemic disease. After reviewing the risks                            and benefits, the patient was deemed in                            satisfactory condition to undergo the procedure.                           After obtaining informed consent, the endoscope was                            passed under direct vision. Throughout the                            procedure, the patient's blood pressure, pulse,  and                            oxygen saturations were monitored continuously. The                            Model GIF-HQ190 [id number]) scope was introduced                            through the mouth, and advanced to the second part                            of duodenum. The upper GI endoscopy was                            accomplished without difficulty. The patient                            tolerated the procedure well. Scope In: Scope Out: Findings:                 The esophagus was normal. ~4cm hiatal hernia                           Scattered mild inflammation characterized by                            congestion (edema) and erythema was found in the                            entire examined stomach. Biopsies were taken with a                            cold forceps for Helicobacter pylori testing using                            CLOtest.                           The examined duodenum was normal except mild  erythema in duodenal bulb. Complications:            No immediate complications. Estimated Blood Loss:     Estimated blood loss was minimal. Impression:               - Normal esophagus.                           - Gastritis. Biopsied.                           - Mild duodenitis. Recommendation:           - Resume previous diet.                           - Continue present medications.                           - Await pathology results.                           - No aspirin, ibuprofen, naproxen, or other                            non-steroidal anti-inflammatory drugs. Mauri Pole, MD 01/01/2016 9:45:07 AM This report has been signed electronically.

## 2016-01-01 NOTE — Progress Notes (Signed)
To recovery, report to Schwartz, RN, VSS. 

## 2016-01-02 ENCOUNTER — Telehealth: Payer: Self-pay

## 2016-01-02 NOTE — Telephone Encounter (Signed)
  Follow up Call-  Call back number 01/01/2016  Post procedure Call Back phone  # [phone]  Permission to leave phone message Yes     Patient questions:  Do you have a fever, pain , or abdominal swelling? No. Pain Score  0 *  Have you tolerated food without any problems? Yes.    Have you been able to return to your normal activities? Yes.    Do you have any questions about your discharge instructions: Diet   No. Medications  No. Follow up visit  No.  Do you have questions or concerns about your Care? No.  Actions: * If pain score is 4 or above: No action needed, pain <4.

## 2016-01-04 ENCOUNTER — Encounter: Payer: Self-pay | Admitting: Gastroenterology

## 2017-04-07 DIAGNOSIS — C44319 Basal cell carcinoma of skin of other parts of face: Secondary | ICD-10-CM | POA: Diagnosis not present

## 2017-05-27 DIAGNOSIS — E789 Disorder of lipoprotein metabolism, unspecified: Secondary | ICD-10-CM | POA: Diagnosis not present

## 2017-05-27 DIAGNOSIS — E118 Type 2 diabetes mellitus with unspecified complications: Secondary | ICD-10-CM | POA: Diagnosis not present

## 2017-06-10 DIAGNOSIS — Z23 Encounter for immunization: Secondary | ICD-10-CM | POA: Diagnosis not present

## 2017-06-10 DIAGNOSIS — E782 Mixed hyperlipidemia: Secondary | ICD-10-CM | POA: Diagnosis not present

## 2017-06-10 DIAGNOSIS — E139 Other specified diabetes mellitus without complications: Secondary | ICD-10-CM | POA: Diagnosis not present

## 2017-06-10 DIAGNOSIS — E1165 Type 2 diabetes mellitus with hyperglycemia: Secondary | ICD-10-CM | POA: Diagnosis not present

## 2017-06-10 DIAGNOSIS — E118 Type 2 diabetes mellitus with unspecified complications: Secondary | ICD-10-CM | POA: Diagnosis not present

## 2017-06-19 DIAGNOSIS — M816 Localized osteoporosis [Lequesne]: Secondary | ICD-10-CM | POA: Diagnosis not present

## 2017-06-19 DIAGNOSIS — Z01419 Encounter for gynecological examination (general) (routine) without abnormal findings: Secondary | ICD-10-CM | POA: Diagnosis not present

## 2017-06-19 DIAGNOSIS — Z1231 Encounter for screening mammogram for malignant neoplasm of breast: Secondary | ICD-10-CM | POA: Diagnosis not present

## 2017-06-19 DIAGNOSIS — N958 Other specified menopausal and perimenopausal disorders: Secondary | ICD-10-CM | POA: Diagnosis not present

## 2017-06-19 DIAGNOSIS — Z6821 Body mass index (BMI) 21.0-21.9, adult: Secondary | ICD-10-CM | POA: Diagnosis not present

## 2017-07-01 DIAGNOSIS — E118 Type 2 diabetes mellitus with unspecified complications: Secondary | ICD-10-CM | POA: Diagnosis not present

## 2017-07-08 DIAGNOSIS — E139 Other specified diabetes mellitus without complications: Secondary | ICD-10-CM | POA: Diagnosis not present

## 2017-07-08 DIAGNOSIS — E789 Disorder of lipoprotein metabolism, unspecified: Secondary | ICD-10-CM | POA: Diagnosis not present

## 2017-07-28 DIAGNOSIS — E113393 Type 2 diabetes mellitus with moderate nonproliferative diabetic retinopathy without macular edema, bilateral: Secondary | ICD-10-CM | POA: Diagnosis not present

## 2017-08-19 DIAGNOSIS — E139 Other specified diabetes mellitus without complications: Secondary | ICD-10-CM | POA: Diagnosis not present

## 2017-08-19 DIAGNOSIS — E789 Disorder of lipoprotein metabolism, unspecified: Secondary | ICD-10-CM | POA: Diagnosis not present

## 2017-08-25 DIAGNOSIS — E113393 Type 2 diabetes mellitus with moderate nonproliferative diabetic retinopathy without macular edema, bilateral: Secondary | ICD-10-CM | POA: Diagnosis not present

## 2017-09-30 DIAGNOSIS — E139 Other specified diabetes mellitus without complications: Secondary | ICD-10-CM | POA: Diagnosis not present

## 2017-09-30 DIAGNOSIS — E782 Mixed hyperlipidemia: Secondary | ICD-10-CM | POA: Diagnosis not present

## 2017-09-30 DIAGNOSIS — E789 Disorder of lipoprotein metabolism, unspecified: Secondary | ICD-10-CM | POA: Diagnosis not present

## 2017-11-09 DIAGNOSIS — R197 Diarrhea, unspecified: Secondary | ICD-10-CM | POA: Diagnosis not present

## 2017-11-09 DIAGNOSIS — I10 Essential (primary) hypertension: Secondary | ICD-10-CM | POA: Diagnosis not present

## 2017-11-09 DIAGNOSIS — E1165 Type 2 diabetes mellitus with hyperglycemia: Secondary | ICD-10-CM | POA: Diagnosis not present

## 2017-11-11 DIAGNOSIS — E789 Disorder of lipoprotein metabolism, unspecified: Secondary | ICD-10-CM | POA: Diagnosis not present

## 2017-11-11 DIAGNOSIS — E139 Other specified diabetes mellitus without complications: Secondary | ICD-10-CM | POA: Diagnosis not present

## 2017-11-11 DIAGNOSIS — E782 Mixed hyperlipidemia: Secondary | ICD-10-CM | POA: Diagnosis not present

## 2017-12-28 DIAGNOSIS — E139 Other specified diabetes mellitus without complications: Secondary | ICD-10-CM | POA: Diagnosis not present

## 2017-12-28 DIAGNOSIS — E782 Mixed hyperlipidemia: Secondary | ICD-10-CM | POA: Diagnosis not present

## 2017-12-28 DIAGNOSIS — E789 Disorder of lipoprotein metabolism, unspecified: Secondary | ICD-10-CM | POA: Diagnosis not present

## 2018-02-23 DIAGNOSIS — E139 Other specified diabetes mellitus without complications: Secondary | ICD-10-CM | POA: Diagnosis not present

## 2018-03-01 DIAGNOSIS — E789 Disorder of lipoprotein metabolism, unspecified: Secondary | ICD-10-CM | POA: Diagnosis not present

## 2018-03-01 DIAGNOSIS — E139 Other specified diabetes mellitus without complications: Secondary | ICD-10-CM | POA: Diagnosis not present

## 2018-03-01 DIAGNOSIS — Z23 Encounter for immunization: Secondary | ICD-10-CM | POA: Diagnosis not present

## 2018-03-01 DIAGNOSIS — E118 Type 2 diabetes mellitus with unspecified complications: Secondary | ICD-10-CM | POA: Diagnosis not present

## 2018-03-01 DIAGNOSIS — E875 Hyperkalemia: Secondary | ICD-10-CM | POA: Diagnosis not present

## 2018-03-01 DIAGNOSIS — E782 Mixed hyperlipidemia: Secondary | ICD-10-CM | POA: Diagnosis not present

## 2018-03-15 DIAGNOSIS — E139 Other specified diabetes mellitus without complications: Secondary | ICD-10-CM | POA: Diagnosis not present

## 2018-03-15 DIAGNOSIS — E875 Hyperkalemia: Secondary | ICD-10-CM | POA: Diagnosis not present

## 2018-03-15 DIAGNOSIS — E789 Disorder of lipoprotein metabolism, unspecified: Secondary | ICD-10-CM | POA: Diagnosis not present

## 2018-03-15 DIAGNOSIS — E782 Mixed hyperlipidemia: Secondary | ICD-10-CM | POA: Diagnosis not present

## 2018-03-31 DIAGNOSIS — E782 Mixed hyperlipidemia: Secondary | ICD-10-CM | POA: Diagnosis not present

## 2018-03-31 DIAGNOSIS — M419 Scoliosis, unspecified: Secondary | ICD-10-CM | POA: Diagnosis not present

## 2018-03-31 DIAGNOSIS — M25512 Pain in left shoulder: Secondary | ICD-10-CM | POA: Diagnosis not present

## 2018-03-31 DIAGNOSIS — Z0000 Encounter for general adult medical examination without abnormal findings: Secondary | ICD-10-CM | POA: Diagnosis not present

## 2018-03-31 DIAGNOSIS — G4700 Insomnia, unspecified: Secondary | ICD-10-CM | POA: Diagnosis not present

## 2018-03-31 DIAGNOSIS — E118 Type 2 diabetes mellitus with unspecified complications: Secondary | ICD-10-CM | POA: Diagnosis not present

## 2018-03-31 DIAGNOSIS — Z23 Encounter for immunization: Secondary | ICD-10-CM | POA: Diagnosis not present

## 2018-03-31 DIAGNOSIS — K219 Gastro-esophageal reflux disease without esophagitis: Secondary | ICD-10-CM | POA: Diagnosis not present

## 2018-03-31 DIAGNOSIS — R159 Full incontinence of feces: Secondary | ICD-10-CM | POA: Diagnosis not present

## 2018-04-19 DIAGNOSIS — E139 Other specified diabetes mellitus without complications: Secondary | ICD-10-CM | POA: Diagnosis not present

## 2018-04-19 DIAGNOSIS — E875 Hyperkalemia: Secondary | ICD-10-CM | POA: Diagnosis not present

## 2018-04-19 DIAGNOSIS — E782 Mixed hyperlipidemia: Secondary | ICD-10-CM | POA: Diagnosis not present

## 2018-04-19 DIAGNOSIS — E789 Disorder of lipoprotein metabolism, unspecified: Secondary | ICD-10-CM | POA: Diagnosis not present

## 2018-05-28 ENCOUNTER — Encounter: Payer: Self-pay | Admitting: Emergency Medicine

## 2018-05-28 ENCOUNTER — Emergency Department: Payer: PPO

## 2018-05-28 ENCOUNTER — Emergency Department
Admission: EM | Admit: 2018-05-28 | Discharge: 2018-05-28 | Disposition: A | Discharge status: Home / Self Care | Payer: PPO | Attending: Emergency Medicine | Admitting: Emergency Medicine

## 2018-05-28 ENCOUNTER — Other Ambulatory Visit: Payer: Self-pay

## 2018-05-28 DIAGNOSIS — Y9301 Activity, walking, marching and hiking: Secondary | ICD-10-CM | POA: Insufficient documentation

## 2018-05-28 DIAGNOSIS — Z85828 Personal history of other malignant neoplasm of skin: Secondary | ICD-10-CM | POA: Diagnosis not present

## 2018-05-28 DIAGNOSIS — Z87891 Personal history of nicotine dependence: Secondary | ICD-10-CM | POA: Diagnosis not present

## 2018-05-28 DIAGNOSIS — S99911A Unspecified injury of right ankle, initial encounter: Secondary | ICD-10-CM | POA: Diagnosis present

## 2018-05-28 DIAGNOSIS — X501XXA Overexertion from prolonged static or awkward postures, initial encounter: Secondary | ICD-10-CM | POA: Insufficient documentation

## 2018-05-28 DIAGNOSIS — Z7902 Long term (current) use of antithrombotics/antiplatelets: Secondary | ICD-10-CM | POA: Diagnosis not present

## 2018-05-28 DIAGNOSIS — E109 Type 1 diabetes mellitus without complications: Secondary | ICD-10-CM | POA: Insufficient documentation

## 2018-05-28 DIAGNOSIS — Y92481 Parking lot as the place of occurrence of the external cause: Secondary | ICD-10-CM | POA: Diagnosis not present

## 2018-05-28 DIAGNOSIS — S82831A Other fracture of upper and lower end of right fibula, initial encounter for closed fracture: Secondary | ICD-10-CM

## 2018-05-28 DIAGNOSIS — Y998 Other external cause status: Secondary | ICD-10-CM | POA: Insufficient documentation

## 2018-05-28 DIAGNOSIS — Z794 Long term (current) use of insulin: Secondary | ICD-10-CM | POA: Insufficient documentation

## 2018-05-28 DIAGNOSIS — Z79899 Other long term (current) drug therapy: Secondary | ICD-10-CM | POA: Diagnosis not present

## 2018-05-28 DIAGNOSIS — M25571 Pain in right ankle and joints of right foot: Secondary | ICD-10-CM | POA: Diagnosis not present

## 2018-05-28 MED ORDER — HYDROCODONE-ACETAMINOPHEN 5-325 MG PO TABS: 1.0000 | ORAL_TABLET | Freq: Four times a day (QID) | ORAL | 0 refills | Status: DC | PRN

## 2018-05-28 NOTE — Discharge Instructions (Addendum)
Follow-up with your regular orthopedist or Dr. Harlow Mares.  Please call him for an appointment.  Wear the splint to protect the ankle.  The fracture in the fibula is very minor so you may take the splint off to get in the shower and then put the splint back on.

## 2018-05-28 NOTE — ED Provider Notes (Signed)
[facility] Emergency Department Provider Note  ____________________________________________   First MD Initiated Contact with Patient 05/28/18 1716     (approximate)  I have reviewed the triage vital signs and the nursing notes.   HISTORY  Chief Complaint Ankle Pain    HPI Michaela Walsh is a 67 y.o. female presents to the emergency department complaining of right ankle pain.  She states she was in a parking lot and tripped over the concrete marker at the parking spot.  She is unsure of how she actually twisted the ankle.  She denies any other injuries at this time.  She states it is painful to bear weight on the right ankle    Past Medical History:  Diagnosis Date  . Anorexia    Loss of appetite  . Carcinoma ([place])    Skin, squamous cell (face)  . Diabetes mellitus   . Digestive-genital tract fistula, female   . Diverticulitis of colon (without mention of hemorrhage)(562.11) 562.10  . GERD (gastroesophageal reflux disease)   . Hyperthyroidism   . Osteoporosis, unspecified   . Other emphysema ([place])   . Paralysis of vocal cords or larynx, unspecified   . Personal history of colonic polyps   . Stenosis of rectum and anus   . Tobacco use disorder   . Ulcer     Patient Active Problem List   Diagnosis Date Noted  . Hx of thyroid cancer 10/25/2010  . Hx of gastric ulcer 10/25/2010  . S/P partial resection of colon 10/25/2010  . CARCINOMA, SKIN, SQUAMOUS CELL, FACE 02/08/2010  . VOCAL CORD PARALYSIS, LEFT 02/08/2010  . EMPHYSEMA, SEVERE 02/08/2010  . RECTOVAGINAL FISTULA 02/08/2010  . OSTEOPOROSIS 02/08/2010  . LOSS OF APPETITE 02/08/2010  . ABDOMINAL PAIN, UNSPECIFIED SITE 02/08/2010  . COLONIC POLYPS, BENIGN, HX OF 02/08/2010  . CARCINOMA, SKIN, SQUAMOUS CELL, FACE 02/08/2010  . DIABETES MELLITUS, TYPE I 11/13/2007  . TOBACCO ABUSE 11/13/2007  . DIVERTICULOSIS, SIGMOID COLON 11/13/2007  . DIVERTICULITIS, ACUTE 11/13/2007  . STENOSIS OF  RECTUM AND ANUS 11/13/2007  . DIABETES MELLITUS, TYPE I, UNCONTROLLED 10/14/2007    Past Surgical History:  Procedure Laterality Date  . CESAREAN SECTION    . COLONOSCOPY    . COLOSTOMY    . DILATION AND CURETTAGE OF UTERUS     x2  . Parotid gland resection    . POLYPECTOMY    . SIGMOID RESECTION / RECTOPEXY     with take down of colostomy  . TUBAL LIGATION     with subsequent tubal pregnancy (redo)  . WRIST SURGERY      Prior to Admission medications   Medication Sig Start Date End Date Taking? Authorizing Provider  Calcium Carbonate-Vitamin D (CALCIUM + D) 600-200 MG-UNIT TABS Take by mouth.      [provider]  Cholecalciferol (VITAMIN D-3 PO) Take 1 tablet by mouth 2 (two) times daily.    [provider]  empagliflozin (JARDIANCE) 25 MG TABS tablet Take 25 mg by mouth daily.    [provider]  ferrous sulfate 325 (65 FE) MG tablet Take 325 mg by mouth 2 (two) times daily with a meal.      [provider]  glucose blood test strip 1 each by Other route as needed. Use as instructed     [provider]  hydrochlorothiazide (HYDRODIURIL) 50 MG tablet Take 50 mg by mouth daily.    [provider]  HYDROcodone-acetaminophen (NORCO/VICODIN) 5-325 MG tablet Take 1 tablet  by mouth every 6 (six) hours as needed for moderate pain. 05/28/18   , Linden Dolin, PA-C  insulin detemir (LEVEMIR) 100 UNIT/ML injection Inject into the skin at bedtime.    [provider]  insulin lispro (HUMALOG KWIKPEN) 100 UNIT/ML injection Inject 5 Units into the skin 3 (three) times daily before meals. Reported on 01/01/2016    [provider]  Multiple Vitamin (MULTIVITAMIN) tablet Take 1 tablet by mouth daily.      [provider]  omeprazole (PRILOSEC) 20 MG capsule TAKE 1 CAPSULE BY MOUTH TWICE DAILY 09/24/11   [place]ayette Dragon, MD  pioglitazone (ACTOS) 15 MG tablet Take 15 mg by mouth daily.    [provider]    pravastatin (PRAVACHOL) 80 MG tablet Take 80 mg by mouth daily.    [provider]  ramipril (ALTACE) 2.5 MG capsule Take 2.5 mg by mouth daily.    [provider]  Saxagliptin-Metformin (KOMBIGLYZE XR) 2.10-998 MG TB24 Take 1 tablet by mouth 2 (two) times daily.      [provider]    Allergies Patient has no known allergies.  Family History  Problem Relation Age of Onset  . Heart disease Father   . Seizures Mother   . Colon polyps Daughter   . Colon cancer Neg Hx     Social History Social History   Tobacco Use  . Smoking status: Former Smoker    Last attempt to quit: 08/31/2001    Years since quitting: 16.7  . Smokeless tobacco: Never Used  Substance Use Topics  . Alcohol use: No  . Drug use: No    Review of Systems  Constitutional: No fever/chills Eyes: No visual changes. ENT: No sore throat. Respiratory: Denies cough Genitourinary: Negative for dysuria. Musculoskeletal: Negative for back pain.  Positive for right ankle pain Skin: Negative for rash.    ____________________________________________   PHYSICAL EXAM:  VITAL SIGNS: ED Triage Vitals  Enc Vitals Group     BP 05/28/18 1711 (!) 107/41     Pulse Rate 05/28/18 1709 71     Resp 05/28/18 1709 16     Temp 05/28/18 1709 98.2 F (36.8 C)     Temp Source 05/28/18 1709 Oral     SpO2 05/28/18 1709 99 %     Weight --      Height --      Head Circumference --      Peak Flow --      Pain Score 05/28/18 1710 8     Pain Loc --      Pain Edu? --      Excl. in [place]? --     Constitutional: Alert and oriented. Well appearing and in no acute distress. Eyes: Conjunctivae are normal.  Head: Atraumatic. Nose: No congestion/rhinnorhea. Mouth/Throat: Mucous membranes are moist.   Neck:  supple no lymphadenopathy noted Cardiovascular: Normal rate, regular rhythm.  Respiratory: Normal respiratory effort.  No retractions GU: deferred Musculoskeletal: FROM all extremities, warm and  well perfused.  The right ankle is swollen and tender at the lateral malleolus. Neurologic:  Normal speech and language.  Skin:  Skin is warm, dry and intact. No rash noted. Psychiatric: Mood and affect are normal. Speech and behavior are normal.  ____________________________________________   LABS (all labs ordered are listed, but only abnormal results are displayed)  Labs Reviewed - No data to display ____________________________________________   ____________________________________________  RADIOLOGY  X-ray of the right ankle shows a small distal fibula fracture  ____________________________________________   PROCEDURES  Procedure(s) performed: Posterior splint and crutches applied by the tech.  Patient was neurovascularly intact post splint application.  Procedures    ____________________________________________   INITIAL IMPRESSION / ASSESSMENT AND PLAN / ED COURSE  Pertinent labs & imaging results that were available during my care of the patient were reviewed by me and considered in my medical decision making (see chart for details).   Patient is 67 year old female presents emergency department complaining of right ankle pain after an injury.  The right ankle is swollen and tender at the distal fibula.  X-ray of the right ankle shows a distal fibula fracture.  Explained the findings to the patient and her husband.  Patient was placed in a posterior OCL.  Given crutches.  She was given a prescription of Vicodin for pain as needed.  She was instructed to take Tylenol or ibuprofen prior and if the narcotic is not needed to not take this medication.  She states she understands will comply.  She was discharged in stable condition.     As part of my medical decision making, I reviewed the following data within the [facility] notes reviewed and incorporated, Old chart reviewed, Radiograph reviewed extremity the right ankle shows a distal  fibula fracture., Notes from prior ED visits and [place] Controlled Substance Database  ____________________________________________   FINAL CLINICAL IMPRESSION(S) / ED DIAGNOSES  Final diagnoses:  Closed traumatic nondisplaced fracture of distal end of right fibula, initial encounter      NEW MEDICATIONS STARTED DURING THIS VISIT:  Discharge Medication List as of 05/28/2018  6:00 PM    START taking these medications   Details  HYDROcodone-acetaminophen (NORCO/VICODIN) 5-325 MG tablet Take 1 tablet by mouth every 6 (six) hours as needed for moderate pain., Starting Fri 05/28/2018, Normal         Note:  This document was prepared using Dragon voice recognition software and may include unintentional dictation errors.    Versie Starks, PA-C 05/28/18 2031    Schuyler Amor, MD 05/28/18 [phone]

## 2018-05-28 NOTE — ED Triage Notes (Signed)
Pt c/o RT ankle pain after mechanical fall today. Denies any head injury. + swelling noted.

## 2018-05-31 ENCOUNTER — Encounter: Payer: Self-pay | Admitting: Emergency Medicine

## 2018-05-31 ENCOUNTER — Encounter
Admission: EM | Disposition: A | Discharge status: Home / Self Care | Payer: Self-pay | Source: Home / Self Care | Attending: Emergency Medicine

## 2018-05-31 ENCOUNTER — Observation Stay: Payer: PPO

## 2018-05-31 ENCOUNTER — Emergency Department: Payer: PPO

## 2018-05-31 ENCOUNTER — Other Ambulatory Visit: Payer: Self-pay

## 2018-05-31 ENCOUNTER — Observation Stay: Payer: PPO | Admitting: Anesthesiology

## 2018-05-31 ENCOUNTER — Observation Stay
Admission: EM | Admit: 2018-05-31 | Discharge: 2018-06-01 | Disposition: A | Discharge status: Home / Self Care | Payer: PPO | Attending: Orthopedic Surgery | Admitting: Orthopedic Surgery

## 2018-05-31 DIAGNOSIS — Z87891 Personal history of nicotine dependence: Secondary | ICD-10-CM | POA: Insufficient documentation

## 2018-05-31 DIAGNOSIS — Z79899 Other long term (current) drug therapy: Secondary | ICD-10-CM | POA: Diagnosis not present

## 2018-05-31 DIAGNOSIS — K219 Gastro-esophageal reflux disease without esophagitis: Secondary | ICD-10-CM | POA: Insufficient documentation

## 2018-05-31 DIAGNOSIS — S52531A Colles' fracture of right radius, initial encounter for closed fracture: Secondary | ICD-10-CM | POA: Diagnosis not present

## 2018-05-31 DIAGNOSIS — R262 Difficulty in walking, not elsewhere classified: Secondary | ICD-10-CM | POA: Diagnosis not present

## 2018-05-31 DIAGNOSIS — W010XXD Fall on same level from slipping, tripping and stumbling without subsequent striking against object, subsequent encounter: Secondary | ICD-10-CM | POA: Diagnosis not present

## 2018-05-31 DIAGNOSIS — I10 Essential (primary) hypertension: Secondary | ICD-10-CM | POA: Diagnosis not present

## 2018-05-31 DIAGNOSIS — R296 Repeated falls: Secondary | ICD-10-CM | POA: Insufficient documentation

## 2018-05-31 DIAGNOSIS — Z85850 Personal history of malignant neoplasm of thyroid: Secondary | ICD-10-CM | POA: Insufficient documentation

## 2018-05-31 DIAGNOSIS — S52501A Unspecified fracture of the lower end of right radius, initial encounter for closed fracture: Secondary | ICD-10-CM | POA: Diagnosis not present

## 2018-05-31 DIAGNOSIS — E119 Type 2 diabetes mellitus without complications: Secondary | ICD-10-CM | POA: Insufficient documentation

## 2018-05-31 DIAGNOSIS — Z794 Long term (current) use of insulin: Secondary | ICD-10-CM | POA: Insufficient documentation

## 2018-05-31 DIAGNOSIS — S6991XA Unspecified injury of right wrist, hand and finger(s), initial encounter: Secondary | ICD-10-CM | POA: Diagnosis not present

## 2018-05-31 DIAGNOSIS — J438 Other emphysema: Secondary | ICD-10-CM | POA: Diagnosis not present

## 2018-05-31 DIAGNOSIS — S52571A Other intraarticular fracture of lower end of right radius, initial encounter for closed fracture: Secondary | ICD-10-CM | POA: Diagnosis not present

## 2018-05-31 DIAGNOSIS — Z9889 Other specified postprocedural states: Secondary | ICD-10-CM

## 2018-05-31 DIAGNOSIS — M25531 Pain in right wrist: Secondary | ICD-10-CM | POA: Diagnosis not present

## 2018-05-31 DIAGNOSIS — J984 Other disorders of lung: Secondary | ICD-10-CM | POA: Diagnosis not present

## 2018-05-31 DIAGNOSIS — S62101A Fracture of unspecified carpal bone, right wrist, initial encounter for closed fracture: Secondary | ICD-10-CM | POA: Diagnosis not present

## 2018-05-31 DIAGNOSIS — S82891A Other fracture of right lower leg, initial encounter for closed fracture: Secondary | ICD-10-CM | POA: Diagnosis not present

## 2018-05-31 DIAGNOSIS — M6281 Muscle weakness (generalized): Secondary | ICD-10-CM | POA: Insufficient documentation

## 2018-05-31 DIAGNOSIS — Z8781 Personal history of (healed) traumatic fracture: Secondary | ICD-10-CM

## 2018-05-31 HISTORY — PX: OPEN REDUCTION INTERNAL FIXATION (ORIF) DISTAL RADIAL FRACTURE: SHX5989

## 2018-05-31 LAB — BASIC METABOLIC PANEL
Anion gap: 11 (ref 5–15)
BUN: 27 mg/dL — ABNORMAL HIGH (ref 8–23)
CO2: 23 mmol/L (ref 22–32)
Calcium: 8.8 mg/dL — ABNORMAL LOW (ref 8.9–10.3)
Chloride: 99 mmol/L (ref 98–111)
Creatinine, Ser: 0.87 mg/dL (ref 0.44–1.00)
GFR calc Af Amer: >60 mL/min (ref >60)
GFR calc non Af Amer: >60 mL/min (ref >60)
Glucose, Bld: 298 mg/dL — ABNORMAL HIGH (ref 70–99)
POTASSIUM: 4.3 mmol/L (ref 3.5–5.1)
Sodium: 133 mmol/L — ABNORMAL LOW (ref 135–145)

## 2018-05-31 LAB — GLUCOSE, CAPILLARY
GLUCOSE-CAPILLARY: 203 mg/dL — AB (ref 70–99)
GLUCOSE-CAPILLARY: 49 mg/dL — AB (ref 70–99)
Glucose-Capillary: 116 mg/dL — ABNORMAL HIGH (ref 70–99)
Glucose-Capillary: 130 mg/dL — ABNORMAL HIGH (ref 70–99)
Glucose-Capillary: 83 mg/dL (ref 70–99)

## 2018-05-31 LAB — CBC
HCT: 38.2 % (ref 36.0–46.0)
Hemoglobin: 12 g/dL (ref 12.0–15.0)
MCH: 31.1 pg (ref 26.0–34.0)
MCHC: 31.4 g/dL (ref 30.0–36.0)
MCV: 99 fL (ref 80.0–100.0)
Platelets: 354 10*3/uL (ref 150–400)
RBC: 3.86 MIL/uL — AB (ref 3.87–5.11)
RDW: 13.7 % (ref 11.5–15.5)
WBC: 12.5 10*3/uL — ABNORMAL HIGH (ref 4.0–10.5)
nRBC: 0 % (ref 0.0–0.2)

## 2018-05-31 LAB — SURGICAL PCR SCREEN
MRSA, PCR: NEGATIVE
Staphylococcus aureus: POSITIVE — AB

## 2018-05-31 SURGERY — OPEN REDUCTION INTERNAL FIXATION (ORIF) DISTAL RADIUS FRACTURE
Anesthesia: General | Site: Arm Lower | Laterality: Right

## 2018-05-31 MED ORDER — ACETAMINOPHEN 10 MG/ML IV SOLN
INTRAVENOUS | Status: AC
  Filled 2018-05-31: qty 100

## 2018-05-31 MED ORDER — ACETAMINOPHEN 650 MG RE SUPP: 650.0000 mg | Freq: Four times a day (QID) | RECTAL | Status: DC | PRN

## 2018-05-31 MED ORDER — HYDROCODONE-ACETAMINOPHEN 5-325 MG PO TABS
1.0000 | ORAL_TABLET | ORAL | Status: DC | PRN
  Administered 2018-05-31 (×2): 1 via ORAL
  Administered 2018-05-31: 2 via ORAL
  Filled 2018-05-31 (×2): qty 1
  Filled 2018-05-31: qty 2

## 2018-05-31 MED ORDER — MIDAZOLAM HCL 5 MG/5ML IJ SOLN
INTRAMUSCULAR | Status: DC | PRN
  Administered 2018-05-31: 2 mg via INTRAVENOUS

## 2018-05-31 MED ORDER — LIDOCAINE HCL (PF) 2 % IJ SOLN
INTRAMUSCULAR | Status: AC
  Filled 2018-05-31: qty 10

## 2018-05-31 MED ORDER — MORPHINE SULFATE (PF) 2 MG/ML IV SOLN
2.0000 mg | INTRAVENOUS | Status: DC | PRN
  Administered 2018-05-31: 2 mg via INTRAVENOUS
  Filled 2018-05-31: qty 1

## 2018-05-31 MED ORDER — INSULIN GLARGINE 100 UNIT/ML ~~LOC~~ SOLN
10.0000 [IU] | Freq: Every day | SUBCUTANEOUS | Status: DC
  Filled 2018-05-31 (×2): qty 0.1

## 2018-05-31 MED ORDER — MUPIROCIN 2 % EX OINT
1.0000 "application " | TOPICAL_OINTMENT | Freq: Two times a day (BID) | CUTANEOUS | Status: DC
  Administered 2018-05-31 – 2018-06-01 (×3): 1 via NASAL
  Filled 2018-05-31: qty 22

## 2018-05-31 MED ORDER — FENTANYL CITRATE (PF) 100 MCG/2ML IJ SOLN
INTRAMUSCULAR | Status: DC | PRN
  Administered 2018-05-31 (×2): 25 ug via INTRAVENOUS

## 2018-05-31 MED ORDER — ONDANSETRON HCL 4 MG/2ML IJ SOLN: 4.0000 mg | Freq: Once | INTRAMUSCULAR | Status: DC | PRN

## 2018-05-31 MED ORDER — ONDANSETRON HCL 4 MG/2ML IJ SOLN
4.0000 mg | Freq: Once | INTRAMUSCULAR | Status: AC
  Administered 2018-05-31: 4 mg via INTRAVENOUS
  Filled 2018-05-31: qty 2

## 2018-05-31 MED ORDER — ONDANSETRON HCL 4 MG/2ML IJ SOLN
INTRAMUSCULAR | Status: DC | PRN
  Administered 2018-05-31: 4 mg via INTRAVENOUS

## 2018-05-31 MED ORDER — DEXTROSE 50 % IV SOLN
25.0000 g | INTRAVENOUS | Status: AC
  Administered 2018-05-31: 25 g via INTRAVENOUS

## 2018-05-31 MED ORDER — CEFAZOLIN SODIUM-DEXTROSE 1-4 GM/50ML-% IV SOLN
1.0000 g | Freq: Once | INTRAVENOUS | Status: AC
  Administered 2018-05-31: 1 g via INTRAVENOUS
  Filled 2018-05-31 (×2): qty 50

## 2018-05-31 MED ORDER — FENTANYL CITRATE (PF) 100 MCG/2ML IJ SOLN
INTRAMUSCULAR | Status: AC
  Filled 2018-05-31: qty 2

## 2018-05-31 MED ORDER — DEXTROSE 50 % IV SOLN
INTRAVENOUS | Status: AC
  Filled 2018-05-31: qty 50

## 2018-05-31 MED ORDER — PROPOFOL 10 MG/ML IV BOLUS
INTRAVENOUS | Status: AC
  Filled 2018-05-31: qty 20

## 2018-05-31 MED ORDER — ACETAMINOPHEN 325 MG PO TABS: 650.0000 mg | ORAL_TABLET | Freq: Four times a day (QID) | ORAL | Status: DC | PRN

## 2018-05-31 MED ORDER — EPHEDRINE SULFATE 50 MG/ML IJ SOLN
INTRAMUSCULAR | Status: DC | PRN
  Administered 2018-05-31: 5 mg via INTRAVENOUS

## 2018-05-31 MED ORDER — CHLORHEXIDINE GLUCONATE CLOTH 2 % EX PADS: 6.0000 | MEDICATED_PAD | Freq: Every day | CUTANEOUS | Status: DC

## 2018-05-31 MED ORDER — SODIUM CHLORIDE 0.9 % IV SOLN
INTRAVENOUS | Status: DC
  Administered 2018-05-31 (×2): via INTRAVENOUS

## 2018-05-31 MED ORDER — PROPOFOL 10 MG/ML IV BOLUS
INTRAVENOUS | Status: DC | PRN
  Administered 2018-05-31: 100 mg via INTRAVENOUS

## 2018-05-31 MED ORDER — ONDANSETRON HCL 4 MG/2ML IJ SOLN
INTRAMUSCULAR | Status: AC
  Filled 2018-05-31: qty 2

## 2018-05-31 MED ORDER — FENTANYL CITRATE (PF) 100 MCG/2ML IJ SOLN
INTRAMUSCULAR | Status: AC
  Administered 2018-05-31: 25 ug via INTRAVENOUS
  Filled 2018-05-31: qty 2

## 2018-05-31 MED ORDER — LIDOCAINE HCL (CARDIAC) PF 100 MG/5ML IV SOSY
PREFILLED_SYRINGE | INTRAVENOUS | Status: DC | PRN
  Administered 2018-05-31: 60 mg via INTRAVENOUS

## 2018-05-31 MED ORDER — ACETAMINOPHEN 10 MG/ML IV SOLN
INTRAVENOUS | Status: DC | PRN
  Administered 2018-05-31: 1000 mg via INTRAVENOUS

## 2018-05-31 MED ORDER — DEXAMETHASONE SODIUM PHOSPHATE 10 MG/ML IJ SOLN
INTRAMUSCULAR | Status: AC
  Filled 2018-05-31: qty 1

## 2018-05-31 MED ORDER — INSULIN ASPART 100 UNIT/ML ~~LOC~~ SOLN
0.0000 [IU] | Freq: Three times a day (TID) | SUBCUTANEOUS | Status: DC
  Administered 2018-05-31: 0.3 [IU] via SUBCUTANEOUS
  Administered 2018-06-01: 9 [IU] via SUBCUTANEOUS
  Administered 2018-06-01: 7 [IU] via SUBCUTANEOUS
  Filled 2018-05-31 (×3): qty 1

## 2018-05-31 MED ORDER — FENTANYL CITRATE (PF) 100 MCG/2ML IJ SOLN
INTRAMUSCULAR | Status: AC
  Administered 2018-06-01: 25 ug via INTRAVENOUS
  Filled 2018-05-31: qty 2

## 2018-05-31 MED ORDER — MORPHINE SULFATE (PF) 4 MG/ML IV SOLN
4.0000 mg | Freq: Once | INTRAVENOUS | Status: AC
  Administered 2018-05-31: 4 mg via INTRAVENOUS
  Filled 2018-05-31: qty 1

## 2018-05-31 MED ORDER — DEXAMETHASONE SODIUM PHOSPHATE 10 MG/ML IJ SOLN
INTRAMUSCULAR | Status: DC | PRN
  Administered 2018-05-31: 5 mg via INTRAVENOUS

## 2018-05-31 MED ORDER — PHENYLEPHRINE HCL 10 MG/ML IJ SOLN
INTRAMUSCULAR | Status: DC | PRN
  Administered 2018-05-31: 100 ug via INTRAVENOUS

## 2018-05-31 MED ORDER — MUPIROCIN 2 % EX OINT
1.0000 "application " | TOPICAL_OINTMENT | Freq: Two times a day (BID) | CUTANEOUS | Status: DC
  Filled 2018-05-31: qty 22

## 2018-05-31 MED ORDER — FENTANYL CITRATE (PF) 100 MCG/2ML IJ SOLN
25.0000 ug | INTRAMUSCULAR | Status: AC | PRN
  Administered 2018-05-31 – 2018-06-01 (×6): 25 ug via INTRAVENOUS

## 2018-05-31 MED ORDER — MIDAZOLAM HCL 2 MG/2ML IJ SOLN
INTRAMUSCULAR | Status: AC
  Filled 2018-05-31: qty 2

## 2018-05-31 SURGICAL SUPPLY — 40 items
BANDAGE ACE 4X5 VEL STRL LF (GAUZE/BANDAGES/DRESSINGS) ×3 IMPLANT
BIT DRILL 2 FAST STEP (BIT) ×3 IMPLANT
BIT DRILL 2.5X4 QC (BIT) ×3 IMPLANT
CANISTER SUCT 1200ML W/VALVE (MISCELLANEOUS) ×3 IMPLANT
CHLORAPREP W/TINT 26ML (MISCELLANEOUS) ×3 IMPLANT
COVER WAND RF STERILE (DRAPES) IMPLANT
CUFF TOURN 18 STER (MISCELLANEOUS) ×3 IMPLANT
DRAPE FLUOR MINI C-ARM 54X84 (DRAPES) ×3 IMPLANT
ELECT REM PT RETURN 9FT ADLT (ELECTROSURGICAL) ×3
ELECTRODE REM PT RTRN 9FT ADLT (ELECTROSURGICAL) ×1 IMPLANT
GAUZE PETRO XEROFOAM 1X8 (MISCELLANEOUS) ×6 IMPLANT
GAUZE SPONGE 4X4 12PLY STRL (GAUZE/BANDAGES/DRESSINGS) ×3 IMPLANT
GLOVE SURG SYN 9.0  PF PI (GLOVE) ×2
GLOVE SURG SYN 9.0 PF PI (GLOVE) ×1 IMPLANT
GOWN SRG 2XL LVL 4 RGLN SLV (GOWNS) ×1 IMPLANT
GOWN STRL NON-REIN 2XL LVL4 (GOWNS) ×2
GOWN STRL REUS W/ TWL LRG LVL3 (GOWN DISPOSABLE) ×1 IMPLANT
GOWN STRL REUS W/TWL LRG LVL3 (GOWN DISPOSABLE) ×2
K-WIRE 1.6 (WIRE) ×6 IMPLANT
KIT TURNOVER KIT A (KITS) ×3 IMPLANT
NEEDLE FILTER BLUNT 18X 1/2SAF (NEEDLE) ×2
NEEDLE FILTER BLUNT 18X1 1/2 (NEEDLE) ×1 IMPLANT
NS IRRIG 500ML POUR BTL (IV SOLUTION) ×3 IMPLANT
PACK EXTREMITY ARMC (MISCELLANEOUS) ×3 IMPLANT
PAD CAST CTTN 4X4 STRL (SOFTGOODS) ×2 IMPLANT
PADDING CAST COTTON 4X4 STRL (SOFTGOODS) ×4
PEG SUBCHONDRAL SMOOTH 2.0X14 (Peg) ×3 IMPLANT
PEG SUBCHONDRAL SMOOTH 2.0X16 (Peg) ×3 IMPLANT
PEG SUBCHONDRAL SMOOTH 2.0X18 (Peg) ×6 IMPLANT
PEG SUBCHONDRAL SMOOTH 2.0X20 (Peg) ×6 IMPLANT
PEG SUBCHONDRAL SMOOTH 2.0X22 (Peg) ×3 IMPLANT
PLATE SHORT 24.4X51.3 RT (Plate) ×3 IMPLANT
SCALPEL PROTECTED #15 DISP (BLADE) ×3 IMPLANT
SCREW MULTI DIRECT 22MM (Screw) ×6 IMPLANT
SPLINT CAST 1 STEP 3X12 (MISCELLANEOUS) ×3 IMPLANT
SUT ETHILON 4-0 (SUTURE) ×2
SUT ETHILON 4-0 FS2 18XMFL BLK (SUTURE) ×1
SUT VICRYL 3-0 27IN (SUTURE) ×3 IMPLANT
SUTURE ETHLN 4-0 FS2 18XMF BLK (SUTURE) ×1 IMPLANT
SYR 3ML LL SCALE MARK (SYRINGE) ×3 IMPLANT

## 2018-05-31 NOTE — Op Note (Signed)
05/31/2018  11:28 PM  PATIENT:  Michaela Walsh  67 y.o. female  PRE-OPERATIVE DIAGNOSIS:  RIGHT DISTAL RADIUS FRACTURE comminuted intra-articular  POST-OPERATIVE DIAGNOSIS:  RIGHT DISTAL RADIUS FRACTURE name  PROCEDURE:  Procedure(s): OPEN REDUCTION INTERNAL FIXATION (ORIF) DISTAL RADIAL FRACTURE (Right)  SURGEON: Laurene Footman, MD  ASSISTANTS: None  ANESTHESIA:   general  EBL:  Total I/O In: 1700 [I.V.:1700] Out: -   BLOOD ADMINISTERED:none  DRAINS: none   LOCAL MEDICATIONS USED:   None  SPECIMEN:  No Specimen  DISPOSITION OF SPECIMEN:  N/A  COUNTS:  YES  TOURNIQUET:   Total Tourniquet Time Documented: Upper Arm (Right) - 25 minutes Total: Upper Arm (Right) - 25 minutes   IMPLANTS: [employer] DVR standard plate with multiple screws and pegs  DICTATION: .Dragon Dictation patient was brought to the operating room and after adequate anesthesia was obtained the right arm was prepped and draped in usual sterile fashion with a tourniquet applied the upper arm after patient identification and timeout procedure were completed, tourniquet was raised.  Incision was made over the FCR tendon and FCR tendon sheath incised the FCR tendon was retracted radially and the deep fascia incised the muscle had quite a bit of hematoma present and was retracted ulnarly with the pronator already torn off the bone.  With the traction having been applied this helped restore length the fracture was quite comminuted with 3 large intra-articular distal fragments.  With a K wire to help maintain reduction of the styloid fragment open reduction was carried out.  A percutaneous K wire was placed through the styloid into the metaphysis holding the large styloid fragment in place.  The plate was then applied at the appropriate level and pinned into position with the distal pegs placed.  After most of these been placed the K wires were removed and the shaft screws placed with 410 mm screws inserted.   Traction was released at this point and to more of the distal peg holes were filled using multidirectional screws on the most ulnar side to avoid too old screws that had previously been placed from a prior injury.  The old screws were not in impinging on anything and were left in place.  Under fluoroscopic views with traction removed the fracture appeared stable with acceptable alignment.  She did have an ulnar plus variant that is was present prior to the injury.  The tourniquet was let down and the wound irrigated.  Wound closed with 3-0 Vicryl subcutaneously and 4-0 nylon for the skin.  Xeroform 4 x 4's web roll volar splint and Ace wrap applied.  PLAN OF CARE: Discharge to home after PACU  PATIENT DISPOSITION:  PACU - hemodynamically stable.

## 2018-05-31 NOTE — Anesthesia Post-op Follow-up Note (Signed)
Anesthesia QCDR form completed.        

## 2018-05-31 NOTE — Consult Note (Signed)
Patient Demographics  Michaela Walsh, is a 67 y.o. female   MRN: [id number]   DOB - 1951/05/28  Admit Date - 05/31/2018    Outpatient Primary MD for the patient is [name], DO  Consult requested in the Hospital by Hessie Knows, MD, On 05/31/2018    Reason for consult ; medical management Chief complaint left wrist fracture HPI: 67 year old female patient with history of diabetes mellitus type 2 had right ankle fracture on Friday, patient had a fall this morning when she tried to get up and noticed severe pain in the right wrist and deformity.  Noted to have right wrist fracture.  Patient is seen by orthopedic doctor Dr. Rudene Christians, tentatively scheduled for ORIF of right wrist this evening.  Patient complains of severe pain in the right wrist.  No shortness of breath or chest pain.  Patient has no numbness of fingers.  Patient takes Lantus, Fiasp insulin at home .  Not started on either of them here.  Past Medical History:  Diagnosis Date  . Anorexia    Loss of appetite  . Carcinoma ([place])    Skin, squamous cell (face)  . Diabetes mellitus   . Digestive-genital tract fistula, female   . Diverticulitis of colon (without mention of hemorrhage)(562.11) 562.10  . GERD (gastroesophageal reflux disease)   . Hyperthyroidism   . Osteoporosis, unspecified   . Other emphysema ([place])   . Paralysis of vocal cords or larynx, unspecified   . Personal history of colonic polyps   . Stenosis of rectum and anus   . Tobacco use disorder   . Ulcer       Past Surgical History:  Procedure Laterality Date  . CESAREAN SECTION    . COLONOSCOPY    . COLOSTOMY    . DILATION AND CURETTAGE OF UTERUS     x2  . Parotid gland resection    . POLYPECTOMY    . SIGMOID RESECTION / RECTOPEXY     with take down of colostomy  . TUBAL LIGATION      with subsequent tubal pregnancy (redo)  . WRIST SURGERY      in for   Chief Complaint  Patient presents with  . Wrist Pain       Review of Systems     No Fever-chills, No Headache, No changes with Vision or hearing, No problems swallowing food or Liquids, No Chest pain, Cough or Shortness of Breath, No Abdominal pain, No Nausea or Vommitting, Bowel movements are regular, No Blood in stool or Urine, No dysuria, No new skin rashes or bruises, Right wrist pain, occasional right ankle pain.  No recent weight gain or loss, No polyuria, polydypsia or polyphagia,    A full 10 point Review of Systems was done, except as stated above, all other Review of Systems were negative.   Social History Social History   Tobacco Use  . Smoking status: Former Smoker    Last attempt to quit: 08/31/2001    Years since quitting: 16.7  .  Smokeless tobacco: Never Used  Substance Use Topics  . Alcohol use: No     Family History Family History  Problem Relation Age of Onset  . Heart disease Father   . Seizures Mother   . Colon polyps Daughter   . Colon cancer Neg Hx     Prior to Admission medications   Medication Sig Start Date End Date Taking? Authorizing Provider  Calcium Carbonate-Vitamin D (CALCIUM + D) 600-200 MG-UNIT TABS Take 1 tablet by mouth 2 (two) times daily.    Yes [provider]  Cholecalciferol (VITAMIN D-3 PO) Take 1 tablet by mouth 2 (two) times daily.   Yes [provider]  ferrous sulfate 325 (65 FE) MG tablet Take 325 mg by mouth 2 (two) times daily with a meal.     Yes [provider]  FIASP FLEXTOUCH 100 UNIT/ML SOPN Inject 35 Units into the skin 3 (three) times daily before meals. Inject UP TO 35 units three times a day before meals 03/12/18  Yes [provider]  glucose blood test strip 1 each by Other route as needed. Use as instructed    Yes [provider]  hydrochlorothiazide (HYDRODIURIL) 50 MG tablet Take 50  mg by mouth daily.   Yes [provider]  HYDROcodone-acetaminophen (NORCO/VICODIN) 5-325 MG tablet Take 1 tablet by mouth every 6 (six) hours as needed for moderate pain. 05/28/18  Yes Fisher, Linden Dolin, PA-C  ibuprofen (ADVIL,MOTRIN) 200 MG tablet Take 200 mg by mouth every 6 (six) hours as needed.   Yes [provider]  LANTUS SOLOSTAR 100 UNIT/ML Solostar Pen Inject 13 Units into the skin at bedtime. 02/25/18  Yes [provider]  Multiple Vitamin (MULTIVITAMIN) tablet Take 1 tablet by mouth daily.     Yes [provider]  omeprazole (PRILOSEC) 20 MG capsule TAKE 1 CAPSULE BY MOUTH TWICE DAILY Patient taking differently: Take 20 mg by mouth 2 (two) times daily as needed.  09/24/11  Yes Lafayette Dragon, MD  pioglitazone (ACTOS) 15 MG tablet Take 15 mg by mouth daily.   Yes [provider]  pravastatin (PRAVACHOL) 80 MG tablet Take 80 mg by mouth daily.   Yes [provider]  empagliflozin (JARDIANCE) 25 MG TABS tablet Take 25 mg by mouth daily.    [provider]  insulin detemir (LEVEMIR) 100 UNIT/ML injection Inject into the skin at bedtime.    [provider]  insulin lispro (HUMALOG KWIKPEN) 100 UNIT/ML injection Inject 5 Units into the skin 3 (three) times daily before meals. Reported on 01/01/2016    [provider]  ramipril (ALTACE) 2.5 MG capsule Take 2.5 mg by mouth daily.    [provider]  Saxagliptin-Metformin (KOMBIGLYZE XR) 2.10-998 MG TB24 Take 1 tablet by mouth 2 (two) times daily.      [provider]    Anti-infectives (From admission, onward)   Start     Dose/Rate Route Frequency Ordered Stop   05/31/18 1700  ceFAZolin (ANCEF) IVPB 1 g/50 mL premix     1 g 100 mL/hr over 30 Minutes Intravenous  Once 05/31/18 0806        Scheduled Meds: . mupirocin ointment  1 application Nasal BID   Continuous Infusions: . sodium chloride 75 mL/hr at 05/31/18 0924  .  ceFAZolin (ANCEF) IV       No Known Allergies  Physical Exam  Vitals  Blood pressure (!) 118/47, pulse 64, temperature 98.3 F (36.8 C), temperature source Oral,  resp. rate 19, height 5\' 2"  (1.575 m), weight 49 kg, SpO2 100 %.   1. General ; alert, awake, oriented, not in distress.  Able to communicate.  But appears slightly uncomfortable because of right wrist pain.  2. Normal affect and insight, Not Suicidal or Homicidal, Awake Alert, Oriented X 3.  3. No F.N deficits, ALL C.Nerves Intact, Strength 5/5 all 4 extremities, Sensation intact all 4 extremities, Plantars down going.  4. Ears and Eyes appear Normal, Conjunctivae clear, PERRLA. Moist Oral Mucosa.  5. Supple Neck, No JVD, No cervical lymphadenopathy appriciated, No Carotid Bruits.  6. Symmetrical Chest wall movement, Good air movement bilaterally, CTAB.  7. RRR, No Gallops, Rubs or Murmurs, No Parasternal Heave.  8. Positive Bowel Sounds, Abdomen Soft, No tenderness, No organomegaly appriciated,No rebound -guarding or rigidity.  9.  No Cyanosis, Normal Skin Turgor, No Skin Rash or Bruise.  10.  Noted to have deformity of right wrist.  Neurologically intact.  11. No Palpable Lymph Nodes in Neck or Axillae    Data Review  CBC Recent Labs  Lab 05/31/18 0643  WBC 12.5*  HGB 12.0  HCT 38.2  PLT 354  MCV 99.0  MCH 31.1  MCHC 31.4  RDW 13.7   ------------------------------------------------------------------------------------------------------------------  Chemistries  Recent Labs  Lab 05/31/18 0643  NA 133*  K 4.3  CL 99  CO2 23  GLUCOSE 298*  BUN 27*  CREATININE 0.87  CALCIUM 8.8*   ------------------------------------------------------------------------------------------------------------------ estimated creatinine clearance is 49.2 mL/min (by C-G formula based on SCr of 0.87 mg/dL). ------------------------------------------------------------------------------------------------------------------ No results for  input(s): TSH, T4TOTAL, T3FREE, THYROIDAB in the last 72 hours.  Invalid input(s): FREET3   Coagulation profile No results for input(s): INR, PROTIME in the last 168 hours. ------------------------------------------------------------------------------------------------------------------- No results for input(s): DDIMER in the last 72 hours. -------------------------------------------------------------------------------------------------------------------  Cardiac Enzymes No results for input(s): CKMB, TROPONINI, MYOGLOBIN in the last 168 hours.  Invalid input(s): CK ------------------------------------------------------------------------------------------------------------------ Invalid input(s): POCBNP   ---------------------------------------------------------------------------------------------------------------  Urinalysis    Component Value Date/Time   COLORURINE YELLOW 05/26/2008 [place] 05/26/2008 0949   LABSPEC 1.016 05/26/2008 0949   PHURINE 6.5 05/26/2008 0949   GLUCOSEU >1000 (A) 05/26/2008 0949   HGBUR NEGATIVE 05/26/2008 0949   BILIRUBINUR NEGATIVE 05/26/2008 0949   KETONESUR NEGATIVE 05/26/2008 0949   PROTEINUR NEGATIVE 05/26/2008 0949   UROBILINOGEN 0.2 05/26/2008 0949   NITRITE NEGATIVE 05/26/2008 0949   LEUKOCYTESUR NEGATIVE 05/26/2008 0949     Imaging results:   Dg Chest 1 View  Result Date: 05/31/2018 CLINICAL DATA:  67 year old female with planned right wrist surgery. Preoperative chest x-ray. EXAM: CHEST  1 VIEW COMPARISON:  Prior chest x-ray 01/22/2010 FINDINGS: The lungs are hyperinflated. There is diffuse mild interstitial prominence as well as chronic bronchitic changes. The overall picture is consistent with COPD. Biapical pleuroparenchymal scarring similar compared to prior. Cardiac and mediastinal contours are within normal limits. No focal airspace consolidation, pulmonary edema, pleural effusion or pneumothorax. No acute  osseous abnormality. IMPRESSION: 1. No acute cardiopulmonary process. 2. Stable chronic pulmonary parenchymal changes consistent with a clinical history of COPD. Electronically Signed   By: Jacqulynn Cadet M.D.   On: 05/31/2018 08:46   Dg Wrist Complete Right  Result Date: 05/31/2018 CLINICAL DATA:  [place] tonight. Wrist pain. EXAM: RIGHT WRIST - COMPLETE 3+ VIEW COMPARISON:  10/22/2016 FINDINGS: Two screws chronically in place in the distal radius. Comminuted Colles type fracture of the distal radius with dorsal tilt of the distal radial articular surface.  No acute ulnar fracture. Probable old post traumatic deformity of the distal ulna. Old healed fractures of the fourth and fifth metacarpals. IMPRESSION: Comminuted Colles type fracture of the distal radius with dorsal tilt of the distal radial articular surface. Previously placed screws for treatment of old fracture of the distal radius. Electronically Signed   By: Nelson Chimes M.D.   On: 05/31/2018 06:55    EKG shows in the emergency room showed normal sinus rhythm at 61 bpm, narrow QRS, normal axis, normal intervals.  Assessment & Plan  Active Problems:   Closed Colles' fracture of right radius    1.  Comminuted Colles' fracture:  medically cleared with moderate risk for right wrst surgery secondary to advanced age, diabetes.  But surgery can be performed by orthopedic.  Patient blood work, EKG unremarkable.,  Chest x-ray showed COPD changes but no pneumonia.  Continue IV morphine for pain control, IV fluids as she is n.p.o.  Plan for ORIF of right wrist by orthopedic this evening. 2.  Recent right ankle fracture status post immobilizer in the emergency room last Friday. 3.  Diabetes mellitus type 2: Continue sliding scale insulin with coverage, start Lantus 10 units nightly as per diabetic coordinator recommendation plan to start Fiasp, Actos after surgery when the diet is resumed. #4.  Essential hypertension: Controlled, continue HCTZ,  lisinopril./  DVT Prophylaxis' start Lovenox after surgery. AM Labs Ordered, also please review Full Orders  Family Communication: Plan discussed with patient .   Thank you for the consult, we will follow the patient with you in the Hospital  Total time spent; 50 minutes.  Epifanio Lesches M.D on 05/31/2018 at 10:42 AM

## 2018-05-31 NOTE — ED Notes (Signed)
ED TO INPATIENT HANDOFF REPORT  Name/Age/Gender Michaela Walsh 67 y.o. female  Code Status    Code Status Orders  (From admission, onward)         Start     Ordered   05/31/18 0803  Full code  Continuous     05/31/18 0806        Code Status History    This patient has a current code status but no historical code status.      Home/SNF/Other Home  Chief Complaint Injured Right Wrist  Level of Care/Admitting Diagnosis ED Disposition    ED Disposition Condition [facility] Area: [place] [100120]  Level of Care: Med-Surg [16]  Diagnosis: Closed Colles' fracture of right radius [621308]  Admitting Physician: Hessie Knows [phone]  Attending Physician: Hessie Knows [phone]  PT Class (Do Not Modify): Observation [104]  PT Acc Code (Do Not Modify): Observation [10022]       Medical History Past Medical History:  Diagnosis Date  . Anorexia    Loss of appetite  . Carcinoma ([place])    Skin, squamous cell (face)  . Diabetes mellitus   . Digestive-genital tract fistula, female   . Diverticulitis of colon (without mention of hemorrhage)(562.11) 562.10  . GERD (gastroesophageal reflux disease)   . Hyperthyroidism   . Osteoporosis, unspecified   . Other emphysema ([place])   . Paralysis of vocal cords or larynx, unspecified   . Personal history of colonic polyps   . Stenosis of rectum and anus   . Tobacco use disorder   . Ulcer     Allergies No Known Allergies  IV Location/Drains/Wounds Patient Lines/Drains/Airways Status   Active Line/Drains/Airways    Name:   Placement date:   Placement time:   Site:   Days:   Peripheral IV 05/31/18 Left Antecubital   05/31/18    0651    Antecubital   less than 1          Labs/Imaging Results for orders placed or performed during the hospital encounter of 05/31/18 (from the past 48 hour(s))  CBC     Status: Abnormal   Collection Time: 05/31/18  6:43 AM  Result Value Ref Range   WBC 12.5 (H) 4.0 - 10.5 K/uL   RBC 3.86 (L) 3.87 - 5.11 MIL/uL   Hemoglobin 12.0 12.0 - 15.0 g/dL   HCT 38.2 36.0 - 46.0 %   MCV 99.0 80.0 - 100.0 fL   MCH 31.1 26.0 - 34.0 pg   MCHC 31.4 30.0 - 36.0 g/dL   RDW 13.7 11.5 - 15.5 %   Platelets 354 150 - 400 K/uL   nRBC 0.0 0.0 - 0.2 %    Comment: Performed at [facility], [address] [place], [place] [address]  Basic metabolic panel     Status: Abnormal   Collection Time: 05/31/18  6:43 AM  Result Value Ref Range   Sodium 133 (L) 135 - 145 mmol/L   Potassium 4.3 3.5 - 5.1 mmol/L   Chloride 99 98 - 111 mmol/L   CO2 23 22 - 32 mmol/L   Glucose, Bld 298 (H) 70 - 99 mg/dL   BUN 27 (H) 8 - 23 mg/dL   Creatinine, Ser 0.87 0.44 - 1.00 mg/dL   Calcium 8.8 (L) 8.9 - 10.3 mg/dL   GFR calc non Af Amer >60 >60 mL/min   GFR calc Af Amer >60 >60 mL/min   Anion gap 11 5 - 15  Comment: Performed at [facility], [address] [place], [place] [address]   Dg Chest 1 View  Result Date: 05/31/2018 CLINICAL DATA:  67 year old female with planned right wrist surgery. Preoperative chest x-ray. EXAM: CHEST  1 VIEW COMPARISON:  Prior chest x-ray 01/22/2010 FINDINGS: The lungs are hyperinflated. There is diffuse mild interstitial prominence as well as chronic bronchitic changes. The overall picture is consistent with COPD. Biapical pleuroparenchymal scarring similar compared to prior. Cardiac and mediastinal contours are within normal limits. No focal airspace consolidation, pulmonary edema, pleural effusion or pneumothorax. No acute osseous abnormality. IMPRESSION: 1. No acute cardiopulmonary process. 2. Stable chronic pulmonary parenchymal changes consistent with a clinical history of COPD. Electronically Signed   By: Jacqulynn Cadet M.D.   On: 05/31/2018 08:46   Dg Wrist Complete Right  Result Date: 05/31/2018 CLINICAL DATA:  [place] tonight. Wrist pain. EXAM: RIGHT WRIST - COMPLETE 3+ VIEW COMPARISON:  10/22/2016 FINDINGS: Two  screws chronically in place in the distal radius. Comminuted Colles type fracture of the distal radius with dorsal tilt of the distal radial articular surface. No acute ulnar fracture. Probable old post traumatic deformity of the distal ulna. Old healed fractures of the fourth and fifth metacarpals. IMPRESSION: Comminuted Colles type fracture of the distal radius with dorsal tilt of the distal radial articular surface. Previously placed screws for treatment of old fracture of the distal radius. Electronically Signed   By: Nelson Chimes M.D.   On: 05/31/2018 06:55    Pending Labs Unresulted Labs (From admission, onward)   None      Vitals/Pain Today's Vitals   05/31/18 0620 05/31/18 0658 05/31/18 0837 05/31/18 0838  BP: (!) 97/33   117/64  Pulse: (!) 59   62  Resp: 16   19  Temp:  97.8 F (36.6 C)  98.2 F (36.8 C)  TempSrc:  Oral    SpO2: 98%   98%  Weight:      Height:      PainSc:   10-Worst pain ever     Isolation Precautions No active isolations  Medications Medications  acetaminophen (TYLENOL) tablet 650 mg (has no administration in time range)    Or  acetaminophen (TYLENOL) suppository 650 mg (has no administration in time range)  0.9 %  sodium chloride infusion (has no administration in time range)  HYDROcodone-acetaminophen (NORCO/VICODIN) 5-325 MG per tablet 1-2 tablet (has no administration in time range)  morphine 2 MG/ML injection 2 mg (has no administration in time range)  ceFAZolin (ANCEF) IVPB 1 g/50 mL premix (has no administration in time range)  morphine 4 MG/ML injection 4 mg (4 mg Intravenous Given 05/31/18 0655)  ondansetron (ZOFRAN) injection 4 mg (4 mg Intravenous Given 05/31/18 0655)    Mobility non-ambulatory  Report called to [facility] on 1A. Pt to transport to [facility]

## 2018-05-31 NOTE — ED Triage Notes (Signed)
Patient states that she fell off of her crutches this am. Patient with pain and swelling to right wrist.

## 2018-05-31 NOTE — Care Management Note (Signed)
Case Management Note  Patient Details  Name: Michaela Walsh MRN: [id number] Date of Birth: 05/29/1951  Subjective/Objective:                  RNCM met with patient that was complaining or right arm pain so meeting was brief.  MOON letter explained. Patient is right arm dominant.  She states at baseline, she is independent and drives.  She lives with her husband and has support also from daughter.  She has no DME available for use at home.  She uses [facility] on [address] for medications.  Her PCP is [name] of [place]. Surgery pending today with Dr. Rudene Christians.  This arm has been injured before per patient and has pins/rods in place.   Action/Plan: Home health list provided by CMS.gov that list agencies with 3-5 star rating.  [facility] updated that [facility].  RNCM will follow.  Expected Discharge Date:  06/01/18               Expected Discharge Plan:     In-House Referral:     Discharge planning Services  CM Consult  Post Acute Care Choice:  Home Health Choice offered to:  Patient  DME Arranged:    DME Agency:     HH Arranged:    [place] Agency:     Status of Service:  In process, will continue to follow  If discussed at Long Length of Stay Meetings, dates discussed:    Additional Comments:  Marshell Garfinkel, RN 05/31/2018, 12:00 PM

## 2018-05-31 NOTE — H&P (Signed)
Subjective:   Patient is a 67 y.o. female presents with right wrist pain and deformity. Onset of symptoms was abrupt starting 2 hours ago with unchanged course since that time. The pain is located in the distal radius. Patient describes the pain as sharp continuous and rated as moderate and severe. Pain has been associated with a fall when she tried to get up today with a prior right ankle fracture on Friday last week she is been using crutches and she stumbled with these today. Patient denies loss of consciousness or any numbness in the hand. Symptoms are aggravated by any movement of the arm. Symptoms improve with holding still. Past history includes diabetes and the prior right ankle fracture.  Previous studies include radiographs and ER.  Patient Active Problem List   Diagnosis Date Noted  . Closed Colles' fracture of right radius 05/31/2018  . Hx of thyroid cancer 10/25/2010  . Hx of gastric ulcer 10/25/2010  . S/P partial resection of colon 10/25/2010  . CARCINOMA, SKIN, SQUAMOUS CELL, FACE 02/08/2010  . VOCAL CORD PARALYSIS, LEFT 02/08/2010  . EMPHYSEMA, SEVERE 02/08/2010  . RECTOVAGINAL FISTULA 02/08/2010  . OSTEOPOROSIS 02/08/2010  . LOSS OF APPETITE 02/08/2010  . ABDOMINAL PAIN, UNSPECIFIED SITE 02/08/2010  . COLONIC POLYPS, BENIGN, HX OF 02/08/2010  . CARCINOMA, SKIN, SQUAMOUS CELL, FACE 02/08/2010  . DIABETES MELLITUS, TYPE I 11/13/2007  . TOBACCO ABUSE 11/13/2007  . DIVERTICULOSIS, SIGMOID COLON 11/13/2007  . DIVERTICULITIS, ACUTE 11/13/2007  . STENOSIS OF RECTUM AND ANUS 11/13/2007  . DIABETES MELLITUS, TYPE I, UNCONTROLLED 10/14/2007   Past Medical History:  Diagnosis Date  . Anorexia    Loss of appetite  . Carcinoma ([place])    Skin, squamous cell (face)  . Diabetes mellitus   . Digestive-genital tract fistula, female   . Diverticulitis of colon (without mention of hemorrhage)(562.11) 562.10  . GERD (gastroesophageal reflux disease)   . Hyperthyroidism   .  Osteoporosis, unspecified   . Other emphysema ([place])   . Paralysis of vocal cords or larynx, unspecified   . Personal history of colonic polyps   . Stenosis of rectum and anus   . Tobacco use disorder   . Ulcer     Past Surgical History:  Procedure Laterality Date  . CESAREAN SECTION    . COLONOSCOPY    . COLOSTOMY    . DILATION AND CURETTAGE OF UTERUS     x2  . Parotid gland resection    . POLYPECTOMY    . SIGMOID RESECTION / RECTOPEXY     with take down of colostomy  . TUBAL LIGATION     with subsequent tubal pregnancy (redo)  . WRIST SURGERY       (Not in a hospital admission) No Known Allergies  Social History   Tobacco Use  . Smoking status: Former Smoker    Last attempt to quit: 08/31/2001    Years since quitting: 16.7  . Smokeless tobacco: Never Used  Substance Use Topics  . Alcohol use: No    Family History  Problem Relation Age of Onset  . Heart disease Father   . Seizures Mother   . Colon polyps Daughter   . Colon cancer Neg Hx     Review of Systems Pertinent items are noted in HPI.  Objective:   Patient Vitals for the past 8 hrs:  BP Temp Temp src Pulse Resp SpO2 Height Weight  05/31/18 0658 - 97.8 F (36.6 C) Oral - - - - -  05/31/18 7616 Marland Kitchen)  97/33 - - (!) 59 16 98 % - -  05/31/18 8101 - - - - - - 5\' 2"  (1.575 m) 49 kg   No intake/output data recorded. No intake/output data recorded.    BP (!) 97/33   Pulse (!) 59   Temp 97.8 F (36.6 C) (Oral)   Resp 16   Ht 5\' 2"  (1.575 m)   Wt 49 kg   SpO2 98%   BMI 19.75 kg/m  General appearance: alert, cooperative, appears older than stated age and mild distress Lungs: clear to auscultation bilaterally Heart: regular rate and rhythm, S1, S2 normal, no murmur, click, rub or gallop Extremities: Silver fork deformity to right distal radius with skin intact. Pulses: 2+ and symmetric Neurologic: Grossly normal   Assessment:   Active Problems:   Closed Colles' fracture of right  radius   Plan:   With her ankle fracture and wrist fracture we will plan on ORIF later today and have therapy work with her training her to use a platform walker.  With the fracture boot she should be weight-bear as tolerated on her right distal fibula fracture.  With regard to her wrist she did have a surgery many years ago and has 2 screws these probably will be in the way of the hardware for the distal radius fracture but if they are not they can be left alone if they do get in the way they will need to be removed

## 2018-05-31 NOTE — Clinical Social Work Note (Signed)
Clinical Social Work Assessment  Patient Details  Name: Michaela Walsh MRN: [id number] Date of Birth: [birth year]  Date of referral:  05/31/18               Reason for consult:  Discharge Planning                Permission sought to share information with:  Case Manager Permission granted to share information::  Yes, Verbal Permission Granted  Name::        Agency::     Relationship::     Contact Information:     Housing/Transportation Living arrangements for the past 2 months:  Single Family Home Source of Information:  Patient Patient Interpreter Needed:  None Criminal Activity/Legal Involvement Pertinent to Current Situation/Hospitalization:  No - Comment as needed Significant Relationships:  Spouse Lives with:  Spouse Do you feel safe going back to the place where you live?  Yes Need for family participation in patient care:  Yes (Comment)  Care giving concerns:  Patient lives in [place] with her husband Jeneen Rinks.    Social Worker assessment / plan:  Holiday representative (CSW) reviewed chart and noted that patient has a radius and ankle fracture. Surgery and PT are pending. CSW met with patient alone at bedside prior to surgery today. Patient was alert and oriented X4 and was laying in the bed. CSW introduced self and explained role of CSW department. Per patient she lives in [place] with her husband and used crutches for her ankle fracture last night and had a fall. Patient reported that her plan is to have surgery and discharge home tomorrow. Patient reported that her husband can provide 24/7 supervision. RN case manager aware of above. CSW will continue to follow and assist as needed.   Employment status:  Retired Nurse, adult PT Recommendations:  Not assessed at this time Information / Referral to community resources:  Other (Comment Required)(Patient prefers to D/C home. )  Patient/Family's Response to care:  Patient prefers to D/C home.    Patient/Family's Understanding of and Emotional Response to Diagnosis, Current Treatment, and Prognosis:  Patient was very pleasant and thanked CSW for assistance.   Emotional Assessment Appearance:  Appears stated age Attitude/Demeanor/Rapport:    Affect (typically observed):  Accepting, Adaptable, Pleasant Orientation:  Oriented to Self, Oriented to Place, Oriented to  Time, Oriented to Situation Alcohol / Substance use:  Not Applicable Psych involvement (Current and /or in the community):  No (Comment)  Discharge Needs  Concerns to be addressed:  No discharge needs identified Readmission within the last 30 days:  No Current discharge risk:  None Barriers to Discharge:  Continued Medical Work up   [employer], Veronia Beets, LCSW 05/31/2018, 3:34 PM

## 2018-05-31 NOTE — ED Notes (Signed)
Patient transported to X-ray 

## 2018-05-31 NOTE — Care Management Obs Status (Signed)
[facility] NOTIFICATION   Patient Details  Name: Michaela Walsh MRN: [id number] Date of Birth: 1951-04-29   Medicare Observation Status Notification Given:  Yes  Patient unable to sign; right hand dominant  Marshell Garfinkel, RN 05/31/2018, 10:08 AM

## 2018-05-31 NOTE — Anesthesia Procedure Notes (Signed)
Procedure Name: LMA Insertion Date/Time: 05/31/2018 10:41 PM Performed by: Dionne Bucy, CRNA Pre-anesthesia Checklist: Patient identified, Patient being monitored, Timeout performed, Emergency Drugs available and Suction available Patient Re-evaluated:Patient Re-evaluated prior to induction Oxygen Delivery Method: Circle system utilized Preoxygenation: Pre-oxygenation with 100% oxygen Induction Type: IV induction Ventilation: Mask ventilation without difficulty LMA: LMA inserted LMA Size: 4.0 Tube type: Oral Number of attempts: 1 Placement Confirmation: positive ETCO2 and breath sounds checked- equal and bilateral Tube secured with: Tape Dental Injury: Teeth and Oropharynx as per pre-operative assessment

## 2018-05-31 NOTE — Progress Notes (Signed)
PT Cancellation Note  Patient Details Name: Michaela Walsh MRN: [id number] DOB: 11/08/1950   Cancelled Treatment:    Reason Eval/Treat Not Completed: Other (comment).  Per chart review, pt pending wrist ORIF today.  PT will continue following acutely but will need new PT order following surgery.     Collie Siad PT, DPT 05/31/2018, 11:30 AM

## 2018-05-31 NOTE — Progress Notes (Addendum)
Inpatient Diabetes Program Recommendations  AACE/ADA: New Consensus Statement on Inpatient Glycemic Control (2015)  Target Ranges:  Prepandial:   less than 140 mg/dL      Peak postprandial:   less than 180 mg/dL (1-2 hours)      Critically ill patients:  140 - 180 mg/dL   Results for Michaela Walsh, Michaela Walsh (MRN [id number]) as of 05/31/2018 10:40  Ref. Range 05/31/2018 06:43  Glucose Latest Ref Range: 70 - 99 mg/dL 298 (H)    Admit with: Ankle and Wrist Fractures  History: DM  Home DM Meds: Lantus 13 units QHS       Fiasp Insulin 35 units TID       Actos 15 mg Daily  Current Orders: None      MD- Please consider the following in-hospital insulin adjustments:  1. Start Lantus 10 units QHS (80% total home dose)  2. Start Novolog Sensitive Correction Scale/ SSI (0-9 units) TID AC + HS     --Will follow patient during hospitalization--  Wyn Quaker RN, MSN, CDE Diabetes Coordinator Inpatient Glycemic Control Team Team Pager: [phone] (8a-5p)

## 2018-05-31 NOTE — Transfer of Care (Signed)
Immediate Anesthesia Transfer of Care Note  Patient: Michaela Walsh  Procedure(s) Performed: OPEN REDUCTION INTERNAL FIXATION (ORIF) DISTAL RADIAL FRACTURE (Right Arm Lower)  Patient Location: PACU  Anesthesia Type:General  Level of Consciousness: sedated  Airway & Oxygen Therapy: Patient Spontanous Breathing and Patient connected to face mask oxygen  Post-op Assessment: Report given to RN and Post -op Vital signs reviewed and stable  Post vital signs: Reviewed and stable  Last Vitals:  Vitals Value Taken Time  BP 135/56 05/31/2018 11:45 PM  Temp    Pulse 68 05/31/2018 11:46 PM  Resp 17 05/31/2018 11:46 PM  SpO2 100 % 05/31/2018 11:46 PM  Vitals shown include unvalidated device data.  Last Pain:  Vitals:   05/31/18 2112  TempSrc:   PainSc: 8       Patients Stated Pain Goal: 5 (88/33/74 4514)  Complications: No apparent anesthesia complications

## 2018-05-31 NOTE — Anesthesia Preprocedure Evaluation (Signed)
Anesthesia Evaluation  Patient identified by MRN, date of birth, ID band Patient awake    Reviewed: Allergy & Precautions, H&P , NPO status , Patient's Chart, lab work & pertinent test results, reviewed documented beta blocker date and time   History of Anesthesia Complications Negative for: history of anesthetic complications  Airway Mallampati: III  TM Distance: >3 FB Neck ROM: full    Dental  (+) Edentulous Upper, Edentulous Lower, Dental Advidsory Given   Pulmonary neg shortness of breath, COPD, neg recent URI, former smoker,           Cardiovascular Exercise Tolerance: Good negative cardio ROS       Neuro/Psych negative neurological ROS  negative psych ROS   GI/Hepatic Neg liver ROS, GERD  ,  Endo/Other  diabetes  Renal/GU negative Renal ROS  negative genitourinary   Musculoskeletal   Abdominal   Peds  Hematology negative hematology ROS (+)   Anesthesia Other Findings Past Medical History: No date: Anorexia     Comment:  Loss of appetite No date: Carcinoma ([place])     Comment:  Skin, squamous cell (face) No date: Diabetes mellitus No date: Digestive-genital tract fistula, female 562.10: Diverticulitis of colon (without mention of hemorrhage)(562. 11) No date: GERD (gastroesophageal reflux disease) No date: Hyperthyroidism No date: Osteoporosis, unspecified No date: Other emphysema ([place]) No date: Paralysis of vocal cords or larynx, unspecified No date: Personal history of colonic polyps No date: Stenosis of rectum and anus No date: Tobacco use disorder No date: Ulcer   Reproductive/Obstetrics negative OB ROS                             Anesthesia Physical Anesthesia Plan  ASA: III  Anesthesia Plan: General   Post-op Pain Management:    Induction: Intravenous  PONV Risk Score and Plan: 3 and Ondansetron, Dexamethasone, Midazolam and Treatment may vary due to age or  medical condition  Airway Management Planned: LMA  Additional Equipment:   Intra-op Plan:   Post-operative Plan: Extubation in OR  Informed Consent: I have reviewed the patients History and Physical, chart, labs and discussed the procedure including the risks, benefits and alternatives for the proposed anesthesia with the patient or authorized representative who has indicated his/her understanding and acceptance.   Dental Advisory Given  Plan Discussed with: Anesthesiologist, CRNA and Surgeon  Anesthesia Plan Comments:         Anesthesia Quick Evaluation

## 2018-05-31 NOTE — ED Provider Notes (Addendum)
[facility] Emergency Department Provider Note  Time seen: 7:12 AM  I have reviewed the triage vital signs and the nursing notes.   HISTORY  Chief Complaint Wrist Pain    HPI Michaela Walsh is a 67 y.o. female with a past medical history of diabetes, gastric reflux, hypertension, presents to the emergency department after a fall with right wrist pain.  According to the patient 3 days ago she fell and suffered a ankle fracture.  She has been using crutches and today while using crutches she slipped and fell onto her right wrist.  Patient has pain and swelling to the right wrist.  Patient denies any other injuries did not hit her head, no LOC.   Past Medical History:  Diagnosis Date  . Anorexia    Loss of appetite  . Carcinoma ([place])    Skin, squamous cell (face)  . Diabetes mellitus   . Digestive-genital tract fistula, female   . Diverticulitis of colon (without mention of hemorrhage)(562.11) 562.10  . GERD (gastroesophageal reflux disease)   . Hyperthyroidism   . Osteoporosis, unspecified   . Other emphysema ([place])   . Paralysis of vocal cords or larynx, unspecified   . Personal history of colonic polyps   . Stenosis of rectum and anus   . Tobacco use disorder   . Ulcer     Patient Active Problem List   Diagnosis Date Noted  . Hx of thyroid cancer 10/25/2010  . Hx of gastric ulcer 10/25/2010  . S/P partial resection of colon 10/25/2010  . CARCINOMA, SKIN, SQUAMOUS CELL, FACE 02/08/2010  . VOCAL CORD PARALYSIS, LEFT 02/08/2010  . EMPHYSEMA, SEVERE 02/08/2010  . RECTOVAGINAL FISTULA 02/08/2010  . OSTEOPOROSIS 02/08/2010  . LOSS OF APPETITE 02/08/2010  . ABDOMINAL PAIN, UNSPECIFIED SITE 02/08/2010  . COLONIC POLYPS, BENIGN, HX OF 02/08/2010  . CARCINOMA, SKIN, SQUAMOUS CELL, FACE 02/08/2010  . DIABETES MELLITUS, TYPE I 11/13/2007  . TOBACCO ABUSE 11/13/2007  . DIVERTICULOSIS, SIGMOID COLON 11/13/2007  . DIVERTICULITIS, ACUTE 11/13/2007  .  STENOSIS OF RECTUM AND ANUS 11/13/2007  . DIABETES MELLITUS, TYPE I, UNCONTROLLED 10/14/2007    Past Surgical History:  Procedure Laterality Date  . CESAREAN SECTION    . COLONOSCOPY    . COLOSTOMY    . DILATION AND CURETTAGE OF UTERUS     x2  . Parotid gland resection    . POLYPECTOMY    . SIGMOID RESECTION / RECTOPEXY     with take down of colostomy  . TUBAL LIGATION     with subsequent tubal pregnancy (redo)  . WRIST SURGERY      Prior to Admission medications   Medication Sig Start Date End Date Taking? Authorizing Provider  Calcium Carbonate-Vitamin D (CALCIUM + D) 600-200 MG-UNIT TABS Take by mouth.      [provider]  Cholecalciferol (VITAMIN D-3 PO) Take 1 tablet by mouth 2 (two) times daily.    [provider]  empagliflozin (JARDIANCE) 25 MG TABS tablet Take 25 mg by mouth daily.    [provider]  ferrous sulfate 325 (65 FE) MG tablet Take 325 mg by mouth 2 (two) times daily with a meal.      [provider]  glucose blood test strip 1 each by Other route as needed. Use as instructed     [provider]  hydrochlorothiazide (HYDRODIURIL) 50 MG tablet Take 50 mg by mouth daily.    [provider]  HYDROcodone-acetaminophen (NORCO/VICODIN) 5-325 MG tablet  Take 1 tablet by mouth every 6 (six) hours as needed for moderate pain. 05/28/18   Fisher, Linden Dolin, PA-C  insulin detemir (LEVEMIR) 100 UNIT/ML injection Inject into the skin at bedtime.    [provider]  insulin lispro (HUMALOG KWIKPEN) 100 UNIT/ML injection Inject 5 Units into the skin 3 (three) times daily before meals. Reported on 01/01/2016    [provider]  Multiple Vitamin (MULTIVITAMIN) tablet Take 1 tablet by mouth daily.      [provider]  omeprazole (PRILOSEC) 20 MG capsule TAKE 1 CAPSULE BY MOUTH TWICE DAILY 09/24/11   Lafayette Dragon, MD  pioglitazone (ACTOS) 15 MG tablet Take 15 mg by mouth daily.    [provider]   pravastatin (PRAVACHOL) 80 MG tablet Take 80 mg by mouth daily.    [provider]  ramipril (ALTACE) 2.5 MG capsule Take 2.5 mg by mouth daily.    [provider]  Saxagliptin-Metformin (KOMBIGLYZE XR) 2.10-998 MG TB24 Take 1 tablet by mouth 2 (two) times daily.      [provider]    No Known Allergies  Family History  Problem Relation Age of Onset  . Heart disease Father   . Seizures Mother   . Colon polyps Daughter   . Colon cancer Neg Hx     Social History Social History   Tobacco Use  . Smoking status: Former Smoker    Last attempt to quit: 08/31/2001    Years since quitting: 16.7  . Smokeless tobacco: Never Used  Substance Use Topics  . Alcohol use: No  . Drug use: No    Review of Systems Constitutional: Negative for loss of consciousness Cardiovascular: Negative for chest pain. Respiratory: Negative for shortness of breath. Gastrointestinal: Negative for abdominal pain Musculoskeletal: Right wrist pain and swelling.  Right ankle pain Neurological: Negative for headache All other ROS negative  ____________________________________________   PHYSICAL EXAM:  VITAL SIGNS: ED Triage Vitals  Enc Vitals Group     BP 05/31/18 0620 (!) 97/33     Pulse Rate 05/31/18 0620 (!) 59     Resp 05/31/18 0620 16     Temp 05/31/18 0658 97.8 F (36.6 C)     Temp Source 05/31/18 0658 Oral     SpO2 05/31/18 0620 98 %     Weight 05/31/18 0613 108 lb (49 kg)     Height 05/31/18 0613 5\' 2"  (1.575 m)     Head Circumference --      Peak Flow --      Pain Score 05/31/18 0613 10     Pain Loc --      Pain Edu? --      Excl. in [place]? --    Constitutional: Alert and oriented. Well appearing and in no distress. Eyes: Normal exam ENT   Head: Normocephalic and atraumatic.   Mouth/Throat: Mucous membranes are moist. Cardiovascular: Normal rate, regular rhythm. No murmur Respiratory: Normal respiratory effort without tachypnea nor retractions.  Breath sounds are clear  Gastrointestinal: Soft and nontender. No distention.  Musculoskeletal: Moderate right wrist swelling/deformity.  Mild swelling.  Short leg splint present in the right lower extremity.  The right upper extremity is neurovascularly intact distally in the hand. Neurologic:  Normal speech and language. No gross focal neurologic deficits Skin:  Skin is warm, dry and intact.  Psychiatric: Mood and affect are normal.   ____________________________________________   EKG reviewed and interpreted by myself shows a normal sinus rhythm at 61 bpm  with a narrow QRS, normal axis, normal intervals, no concerning ST changes.  RADIOLOGY  Comminuted Colles' fracture of the right wrist.  ____________________________________________   INITIAL IMPRESSION / ASSESSMENT AND PLAN / ED COURSE  Pertinent labs & imaging results that were available during my care of the patient were reviewed by me and considered in my medical decision making (see chart for details).  Patient presents to the emergency department for right wrist pain and swelling after a fall.  3 days ago patient had right ankle pain after a fall found to have a distal fibula fracture.  Patient currently has a right lower extremity short leg splint.  Was using crutches however after this follow-up patient states she cannot bear weight on her hand due to pain.  X-ray today confirms Colles' fracture of the right wrist.  Old hardware in place in the right wrist as well.  We will discuss with orthopedics.  Patient agreeable to plan of care.  I discussed the patient with Dr. Rudene Christians of orthopedics, he will be in to see the patient.  Orthopedics will be admitting to their service for surgery later today.  Basic labs are nonrevealing besides a mildly elevated blood glucose.   EKG viewed and interpreted by myself shows a normal sinus rhythm at 61 bpm with a narrow QRS, normal axis, largely normal intervals with no concerning ST  changes.  ____________________________________________   FINAL CLINICAL IMPRESSION(S) / ED DIAGNOSES  Right wrist fracture    Harvest Dark, MD 05/31/18 2706    Harvest Dark, MD 05/31/18 2376    Harvest Dark, MD 06/12/18 1406

## 2018-06-01 ENCOUNTER — Encounter: Payer: Self-pay | Admitting: Orthopedic Surgery

## 2018-06-01 ENCOUNTER — Observation Stay: Payer: PPO

## 2018-06-01 DIAGNOSIS — S82891A Other fracture of right lower leg, initial encounter for closed fracture: Secondary | ICD-10-CM | POA: Diagnosis not present

## 2018-06-01 DIAGNOSIS — S52501D Unspecified fracture of the lower end of right radius, subsequent encounter for closed fracture with routine healing: Secondary | ICD-10-CM | POA: Diagnosis not present

## 2018-06-01 DIAGNOSIS — S62101A Fracture of unspecified carpal bone, right wrist, initial encounter for closed fracture: Secondary | ICD-10-CM | POA: Diagnosis not present

## 2018-06-01 DIAGNOSIS — I10 Essential (primary) hypertension: Secondary | ICD-10-CM | POA: Diagnosis not present

## 2018-06-01 LAB — GLUCOSE, CAPILLARY
GLUCOSE-CAPILLARY: 323 mg/dL — AB (ref 70–99)
Glucose-Capillary: 306 mg/dL — ABNORMAL HIGH (ref 70–99)
Glucose-Capillary: 353 mg/dL — ABNORMAL HIGH (ref 70–99)

## 2018-06-01 MED ORDER — HYDROCODONE-ACETAMINOPHEN 5-325 MG PO TABS: 1.0000 | ORAL_TABLET | ORAL | 0 refills | Status: AC | PRN

## 2018-06-01 MED ORDER — METOCLOPRAMIDE HCL 10 MG PO TABS: 5.0000 mg | ORAL_TABLET | Freq: Three times a day (TID) | ORAL | Status: DC | PRN

## 2018-06-01 MED ORDER — KETOROLAC TROMETHAMINE 30 MG/ML IJ SOLN
30.0000 mg | Freq: Once | INTRAMUSCULAR | Status: AC
  Administered 2018-06-01: 30 mg via INTRAVENOUS
  Filled 2018-06-01: qty 1

## 2018-06-01 MED ORDER — ONDANSETRON HCL 4 MG PO TABS: 4.0000 mg | ORAL_TABLET | Freq: Four times a day (QID) | ORAL | Status: DC | PRN

## 2018-06-01 MED ORDER — ONDANSETRON HCL 4 MG/2ML IJ SOLN: 4.0000 mg | Freq: Four times a day (QID) | INTRAMUSCULAR | Status: DC | PRN

## 2018-06-01 MED ORDER — DOCUSATE SODIUM 100 MG PO CAPS
100.0000 mg | ORAL_CAPSULE | Freq: Two times a day (BID) | ORAL | Status: DC
  Administered 2018-06-01: 100 mg via ORAL
  Filled 2018-06-01: qty 1

## 2018-06-01 MED ORDER — ACETAMINOPHEN 500 MG PO TABS: 500.0000 mg | ORAL_TABLET | Freq: Four times a day (QID) | ORAL | Status: DC

## 2018-06-01 MED ORDER — ACETAMINOPHEN 325 MG PO TABS: 325.0000 mg | ORAL_TABLET | Freq: Four times a day (QID) | ORAL | Status: DC | PRN

## 2018-06-01 MED ORDER — METOCLOPRAMIDE HCL 5 MG/ML IJ SOLN: 5.0000 mg | Freq: Three times a day (TID) | INTRAMUSCULAR | Status: DC | PRN

## 2018-06-01 MED ORDER — MAGNESIUM HYDROXIDE 400 MG/5ML PO SUSP: 30.0000 mL | Freq: Every day | ORAL | Status: DC | PRN

## 2018-06-01 MED ORDER — FENTANYL CITRATE (PF) 100 MCG/2ML IJ SOLN
25.0000 ug | INTRAMUSCULAR | Status: AC | PRN
  Administered 2018-06-01 (×2): 25 ug via INTRAVENOUS

## 2018-06-01 MED ORDER — SODIUM CHLORIDE 0.9 % IV SOLN
INTRAVENOUS | Status: DC
  Administered 2018-06-01: 01:00:00 via INTRAVENOUS

## 2018-06-01 MED ORDER — TRAMADOL HCL 50 MG PO TABS: 50.0000 mg | ORAL_TABLET | Freq: Four times a day (QID) | ORAL | Status: DC

## 2018-06-01 MED ORDER — MORPHINE SULFATE (PF) 4 MG/ML IV SOLN: 0.5000 mg | INTRAVENOUS | Status: DC | PRN

## 2018-06-01 MED ORDER — HYDROCODONE-ACETAMINOPHEN 5-325 MG PO TABS
1.0000 | ORAL_TABLET | ORAL | Status: DC | PRN
  Administered 2018-06-01 (×2): 2 via ORAL
  Filled 2018-06-01 (×4): qty 2

## 2018-06-01 NOTE — Progress Notes (Signed)
PT Cancellation Note  Patient Details Name: Michaela Walsh MRN: [id number] DOB: July 17, 1950   Cancelled Treatment:    Reason Eval/Treat Not Completed: (Consult received and chart reviewed.  Patient currently eating breakfast. Will re-attempt evaluation at later time/date as medically appropriate.)   Olayinka Gathers H. Owens Shark, PT, DPT, NCS 06/01/18, 9:26 AM [phone]

## 2018-06-01 NOTE — Progress Notes (Signed)
   Subjective: 1 Day Post-Op Procedure(s) (LRB): OPEN REDUCTION INTERNAL FIXATION (ORIF) DISTAL RADIAL FRACTURE (Right) Patient reports pain as mild.   Patient is well, and has had no acute complaints or problems Denies any CP, SOB, ABD pain. We will continue therapy today.  Plan is to go Home after hospital stay.  Objective: Vital signs in last 24 hours: Temp:  [97.8 F (36.6 C)-99.1 F (37.3 C)] 98.2 F (36.8 C) (12/10 0809) Pulse Rate:  [55-84] 84 (12/10 0809) Resp:  [13-20] 18 (12/10 0809) BP: (104-135)/(49-57) 108/56 (12/10 0809) SpO2:  [93 %-100 %] 98 % (12/10 0809)  Intake/Output from previous day: 12/09 0701 - 12/10 0700 In: 2913.1 [I.V.:2913.1] Out: 5 [Blood:5] Intake/Output this shift: No intake/output data recorded.  Recent Labs    05/31/18 0643  HGB 12.0   Recent Labs    05/31/18 0643  WBC 12.5*  RBC 3.86*  HCT 38.2  PLT 354   Recent Labs    05/31/18 0643  NA 133*  K 4.3  CL 99  CO2 23  BUN 27*  CREATININE 0.87  GLUCOSE 298*  CALCIUM 8.8*   No results for input(s): LABPT, INR in the last 72 hours.  EXAM General - Patient is Alert, Appropriate and Oriented Right upper extremity - Neurovascular intact Sensation intact distally Intact pulses distally No cellulitis present Compartment soft Ortho-Glass splint intact. Dressing - dressing C/D/I and no drainage Motor Function -intact, moving right foot as well as right hand digits   Past Medical History:  Diagnosis Date  . Anorexia    Loss of appetite  . Carcinoma ([place])    Skin, squamous cell (face)  . Diabetes mellitus   . Digestive-genital tract fistula, female   . Diverticulitis of colon (without mention of hemorrhage)(562.11) 562.10  . GERD (gastroesophageal reflux disease)   . Hyperthyroidism   . Osteoporosis, unspecified   . Other emphysema ([place])   . Paralysis of vocal cords or larynx, unspecified   . Personal history of colonic polyps   . Stenosis of rectum and anus   .  Tobacco use disorder   . Ulcer     Assessment/Plan:   1 Day Post-Op Procedure(s) (LRB): OPEN REDUCTION INTERNAL FIXATION (ORIF) DISTAL RADIAL FRACTURE (Right) Active Problems:   Closed Colles' fracture of right radius Nondisplaced right distal fibula fracture Estimated body mass index is 19.75 kg/m as calculated from the following:   Height as of this encounter: 5\' 2"  (1.575 m).   Weight as of this encounter: 49 kg. Advance diet Up with therapy, nonweightbearing right upper extremity.  Recommend platform walker.  Weightbearing as tolerated right lower extremity with walking boot for nondisplaced distal fibula fracture.  Possible discharge home today pending good progress of physical therapy. Patient will need to follow-up in the clinic end of this week for dressing change and skin check.    Ronney Asters, PA-C [facility] 06/01/2018, 10:16 AM

## 2018-06-01 NOTE — Discharge Instructions (Signed)
Keep right upper extremity splint clean and dry at all times.  Did not put any weight on the right upper extremity.  Please walk with platform walker.  Wear boot on right lower extremity while ambulating.  Take Norco as prescribed for pain.  Follow-up with [facility] Thursday or Friday of this week for dressing change and skin check.  Call [facility] for any concerns or questions.

## 2018-06-01 NOTE — Progress Notes (Signed)
Home equipment [facility] and walker) sent with pt. Pt wheeled to car by staff. Daughter providing transportation.

## 2018-06-01 NOTE — Progress Notes (Signed)
IV removed d/c summary reviewed with patient and daughter. Prescription given to patient.

## 2018-06-01 NOTE — Care Management (Signed)
Right side platform walker and bedside commode requested from [place] with [facility]. Patient refused home health therapy.  Surgeon notified.

## 2018-06-01 NOTE — Discharge Summary (Signed)
Physician Discharge Summary  Patient ID: Michaela Walsh MRN: [id number] DOB/AGE: 07-27-1950 67 y.o.  Admit date: 05/31/2018 Discharge date: 06/01/2018  Admission Diagnoses:  Injured Right Wrist   Discharge Diagnoses: Patient Active Problem List   Diagnosis Date Noted  . Closed Colles' fracture of right radius 05/31/2018  . Hx of thyroid cancer 10/25/2010  . Hx of gastric ulcer 10/25/2010  . S/P partial resection of colon 10/25/2010  . CARCINOMA, SKIN, SQUAMOUS CELL, FACE 02/08/2010  . VOCAL CORD PARALYSIS, LEFT 02/08/2010  . EMPHYSEMA, SEVERE 02/08/2010  . RECTOVAGINAL FISTULA 02/08/2010  . OSTEOPOROSIS 02/08/2010  . LOSS OF APPETITE 02/08/2010  . ABDOMINAL PAIN, UNSPECIFIED SITE 02/08/2010  . COLONIC POLYPS, BENIGN, HX OF 02/08/2010  . CARCINOMA, SKIN, SQUAMOUS CELL, FACE 02/08/2010  . DIABETES MELLITUS, TYPE I 11/13/2007  . TOBACCO ABUSE 11/13/2007  . DIVERTICULOSIS, SIGMOID COLON 11/13/2007  . DIVERTICULITIS, ACUTE 11/13/2007  . STENOSIS OF RECTUM AND ANUS 11/13/2007  . DIABETES MELLITUS, TYPE I, UNCONTROLLED 10/14/2007    Past Medical History:  Diagnosis Date  . Anorexia    Loss of appetite  . Carcinoma ([place])    Skin, squamous cell (face)  . Diabetes mellitus   . Digestive-genital tract fistula, female   . Diverticulitis of colon (without mention of hemorrhage)(562.11) 562.10  . GERD (gastroesophageal reflux disease)   . Hyperthyroidism   . Osteoporosis, unspecified   . Other emphysema ([place])   . Paralysis of vocal cords or larynx, unspecified   . Personal history of colonic polyps   . Stenosis of rectum and anus   . Tobacco use disorder   . Ulcer      Transfusion: none   Consultants (if any):   Discharged Condition: Improved  Hospital Course: Michaela Walsh is an 67 y.o. female who was admitted 05/31/2018 with a diagnosis of displaced right wrist fracture and went to the operating room on 05/31/2018 and underwent the above named procedures.     Surgeries: Procedure(s): OPEN REDUCTION INTERNAL FIXATION (ORIF) DISTAL RADIAL FRACTURE on 05/31/2018 Patient tolerated the surgery well. Taken to PACU where she was stabilized and then transferred to the orthopedic floor.  Physical therapy started on day #1 for gait training and transfer. OT started day #1 for ADL and assisted devices.  Patient's foley was d/c on day #1.  On post op day #1 patient was stable and ready for discharge to home.  Implants: [employer] DVR standard plate with multiple screws and pegs  She was given perioperative antibiotics:  Anti-infectives (From admission, onward)   Start     Dose/Rate Route Frequency Ordered Stop   05/31/18 1700  ceFAZolin (ANCEF) IVPB 1 g/50 mL premix     1 g 100 mL/hr over 30 Minutes Intravenous  Once 05/31/18 0806 05/31/18 2301    .   She benefited maximally from the hospital stay and there were no complications.    Recent vital signs:  Vitals:   06/01/18 0421 06/01/18 0809  BP: (!) 111/49 (!) 108/56  Pulse: 80 84  Resp: 16 18  Temp: 99 F (37.2 C) 98.2 F (36.8 C)  SpO2: 98% 98%    Recent laboratory studies:  Lab Results  Component Value Date   HGB 12.0 05/31/2018   HGB 12.6 10/24/2010   HGB 11.8 (L) 02/08/2010   Lab Results  Component Value Date   WBC 12.5 (H) 05/31/2018   PLT 354 05/31/2018   No results found for: INR Lab Results  Component Value Date   NA  133 (L) 05/31/2018   K 4.3 05/31/2018   CL 99 05/31/2018   CO2 23 05/31/2018   BUN 27 (H) 05/31/2018   CREATININE 0.87 05/31/2018   GLUCOSE 298 (H) 05/31/2018    Discharge Medications:   Allergies as of 06/01/2018   No Known Allergies     Medication List    TAKE these medications   Calcium Carbonate-Vitamin D 600-200 MG-UNIT Tabs Take 1 tablet by mouth 2 (two) times daily.   ferrous sulfate 325 (65 FE) MG tablet Take 325 mg by mouth 2 (two) times daily with a meal.   FIASP FLEXTOUCH 100 UNIT/ML Sopn Generic drug:  Insulin Aspart  (w/Niacinamide) Inject 35 Units into the skin 3 (three) times daily before meals. Inject UP TO 35 units three times a day before meals   glucose blood test strip 1 each by Other route as needed. Use as instructed   HUMALOG KWIKPEN 100 UNIT/ML injection Generic drug:  insulin lispro Inject 5 Units into the skin 3 (three) times daily before meals. Reported on 01/01/2016   hydrochlorothiazide 50 MG tablet Commonly known as:  HYDRODIURIL Take 50 mg by mouth daily.   HYDROcodone-acetaminophen 5-325 MG tablet Commonly known as:  NORCO/VICODIN Take 1-2 tablets by mouth every 4 (four) hours as needed for moderate pain (pain score 4-6). What changed:    how much to take  when to take this  reasons to take this   ibuprofen 200 MG tablet Commonly known as:  ADVIL,MOTRIN Take 200 mg by mouth every 6 (six) hours as needed.   insulin detemir 100 UNIT/ML injection Commonly known as:  LEVEMIR Inject into the skin at bedtime.   JARDIANCE 25 MG Tabs tablet Generic drug:  empagliflozin Take 25 mg by mouth daily.   KOMBIGLYZE XR 2.10-998 MG Tb24 Generic drug:  Saxagliptin-Metformin Take 1 tablet by mouth 2 (two) times daily.   LANTUS SOLOSTAR 100 UNIT/ML Solostar Pen Generic drug:  Insulin Glargine Inject 13 Units into the skin at bedtime.   multivitamin tablet Take 1 tablet by mouth daily.   omeprazole 20 MG capsule Commonly known as:  PRILOSEC TAKE 1 CAPSULE BY MOUTH TWICE DAILY What changed:    when to take this  reasons to take this   pioglitazone 15 MG tablet Commonly known as:  ACTOS Take 15 mg by mouth daily.   pravastatin 80 MG tablet Commonly known as:  PRAVACHOL Take 80 mg by mouth daily.   ramipril 2.5 MG capsule Commonly known as:  ALTACE Take 2.5 mg by mouth daily.   VITAMIN D-3 PO Take 1 tablet by mouth 2 (two) times daily.            Durable Medical Equipment  (From admission, onward)         Start     Ordered   06/01/18 1337  For home use  only DME Walker platform  Once    Comments:  And right wrist fracture  Question:  Patient needs a walker to treat with the following condition  Answer:  Ankle fracture, lateral malleolus, closed   06/01/18 1336   06/01/18 1336  For home use only DME Bedside commode  Once    Comments:  And wrist fracture  Question:  Patient needs a bedside commode to treat with the following condition  Answer:  Ankle fracture, lateral malleolus, closed   06/01/18 1336   06/01/18 1317  For home use only DME Bedside commode  Once    Question:  Patient needs  a bedside commode to treat with the following condition  Answer:  Difficulty walking   06/01/18 1317   06/01/18 1317  For home use only DME Walker platform  Once    Comments:  Right side  Question:  Patient needs a walker to treat with the following condition  Answer:  Difficulty walking   06/01/18 1317          Diagnostic Studies: Dg Chest 1 View  Result Date: 05/31/2018 CLINICAL DATA:  67 year old female with planned right wrist surgery. Preoperative chest x-ray. EXAM: CHEST  1 VIEW COMPARISON:  Prior chest x-ray 01/22/2010 FINDINGS: The lungs are hyperinflated. There is diffuse mild interstitial prominence as well as chronic bronchitic changes. The overall picture is consistent with COPD. Biapical pleuroparenchymal scarring similar compared to prior. Cardiac and mediastinal contours are within normal limits. No focal airspace consolidation, pulmonary edema, pleural effusion or pneumothorax. No acute osseous abnormality. IMPRESSION: 1. No acute cardiopulmonary process. 2. Stable chronic pulmonary parenchymal changes consistent with a clinical history of COPD. Electronically Signed   By: Jacqulynn Cadet M.D.   On: 05/31/2018 08:46   Dg Wrist 2 Views Right  Result Date: 06/01/2018 CLINICAL DATA:  Operative fixation of a comminuted distal right radius fracture. EXAM: RIGHT WRIST - 2 VIEW COMPARISON:  Yesterday. FINDINGS: Interval screw and plate fixation  of the previously demonstrated comminuted distal radius fracture with some residual radial displacement of the distal fragments. No angulation. IMPRESSION: Hardware fixation of the previously demonstrated comminuted distal radius fracture, as described above. Electronically Signed   By: Claudie Revering M.D.   On: 06/01/2018 00:48   Dg Wrist Complete Right  Result Date: 05/31/2018 CLINICAL DATA:  [place] tonight. Wrist pain. EXAM: RIGHT WRIST - COMPLETE 3+ VIEW COMPARISON:  10/22/2016 FINDINGS: Two screws chronically in place in the distal radius. Comminuted Colles type fracture of the distal radius with dorsal tilt of the distal radial articular surface. No acute ulnar fracture. Probable old post traumatic deformity of the distal ulna. Old healed fractures of the fourth and fifth metacarpals. IMPRESSION: Comminuted Colles type fracture of the distal radius with dorsal tilt of the distal radial articular surface. Previously placed screws for treatment of old fracture of the distal radius. Electronically Signed   By: Nelson Chimes M.D.   On: 05/31/2018 06:55   Dg Ankle Complete Right  Result Date: 05/28/2018 CLINICAL DATA:  Right ankle pain and swelling following a fall today. EXAM: RIGHT ANKLE - COMPLETE 3+ VIEW COMPARISON:  None. FINDINGS: Pronounced diffuse lateral soft tissue swelling with anterior swelling as well. Small, essentially nondisplaced fracture in the lateral cortex of the distal fibula in the proximal metadiaphyseal region. No other fractures and no effusion seen. IMPRESSION: Small, essentially nondisplaced fracture of the distal fibula. Electronically Signed   By: Claudie Revering M.D.   On: 05/28/2018 17:41    Disposition: Discharge disposition: 01-Home or Self Care         Follow-up Information    Duanne Guess, PA-C Follow up.   Specialties:  Orthopedic Surgery, Emergency Medicine Why:  Follow-up with [facility] orthopedics on 06/03/2018 or 06/04/2018 for right wrist dressing change and  skin check Contact information: [place] [id number] [phone]            Signed: Feliberto Gottron 06/01/2018, 2:50 PM

## 2018-06-01 NOTE — Progress Notes (Addendum)
Inpatient Diabetes Program Recommendations  AACE/ADA: New Consensus Statement on Inpatient Glycemic Control (2015)  Target Ranges:  Prepandial:   less than 140 mg/dL      Peak postprandial:   less than 180 mg/dL (1-2 hours)      Critically ill patients:  140 - 180 mg/dL   Results for Michaela Walsh, Michaela Walsh (MRN [id number]) as of 06/01/2018 08:49  Ref. Range 05/31/2018 11:45 05/31/2018 16:38 05/31/2018 16:59 05/31/2018 20:36 05/31/2018 23:50  Glucose-Capillary Latest Ref Range: 70 - 99 mg/dL 203 (H)  3 units NOVOLOG  49 (L) 130 (H) 83 116 (H)  5 mg Decadron given at 11pm   Results for Michaela Walsh, Michaela Walsh (MRN [id number]) as of 06/01/2018 08:49  Ref. Range 06/01/2018 08:07  Glucose-Capillary Latest Ref Range: 70 - 99 mg/dL 323 (H)    Home DM Meds: Lantus 13 units QHS                             Fiasp Insulin 35 units TID                             Actos 15 mg Daily  Current Orders: Lantus 10 units QHS       Novolog Sensitive Correction Scale/ SSI (0-9 units) TID AC     Decadron 5 mg X 1 dose given at 11pm last night.   Pt got 3 units Novolog at 12pm yest for CBG of 203 and bottomed out to 49 mg/dl by 4pm.   RN called me this AM with concerns about giving 7 units Novolog SSi this AM for CBG of 323 mg/dl.   Recommended 4 units Novolog instead of 7 units per SSI. RN to call Dr. Manuella Ghazi and get orders to just give 4 units Novolog.    MD- Please note that Lantus HELD last PM.  May want to give Lantus earlier than bedtime tonight.  Suspect CBGs running high today b/c Lantus was HELD last PM.     --Will follow patient during hospitalization--  Wyn Quaker RN, MSN, CDE Diabetes Coordinator Inpatient Glycemic Control Team Team Pager: [phone] (8a-5p)

## 2018-06-01 NOTE — Anesthesia Postprocedure Evaluation (Signed)
Anesthesia Post Note  Patient: Michaela Walsh  Procedure(s) Performed: OPEN REDUCTION INTERNAL FIXATION (ORIF) DISTAL RADIAL FRACTURE (Right Arm Lower)  Patient location during evaluation: PACU Anesthesia Type: General Level of consciousness: awake and alert Pain management: pain level controlled Vital Signs Assessment: post-procedure vital signs reviewed and stable Respiratory status: spontaneous breathing, nonlabored ventilation, respiratory function stable and patient connected to nasal cannula oxygen Cardiovascular status: blood pressure returned to baseline and stable Postop Assessment: no apparent nausea or vomiting Anesthetic complications: no     Last Vitals:  Vitals:   06/01/18 0324 06/01/18 0421  BP: (!) 117/55 (!) 111/49  Pulse: 75 80  Resp: 16 16  Temp: 37.1 C 37.2 C  SpO2: 97% 98%    Last Pain:  Vitals:   06/01/18 0421  TempSrc: Oral  PainSc:                  [name]

## 2018-06-01 NOTE — Progress Notes (Signed)
[facility] [place] NAME: Joanette Silveria    MR#:  [id number]  DATE OF BIRTH:  06-30-1950  SUBJECTIVE:  CHIEF COMPLAINT:   Chief Complaint  Patient presents with  . Wrist Pain  no complaints REVIEW OF SYSTEMS:  Review of Systems  Constitutional: Negative for diaphoresis, fever, malaise/fatigue and weight loss.  HENT: Negative for ear discharge, ear pain, hearing loss, nosebleeds, sore throat and tinnitus.   Eyes: Negative for blurred vision and pain.  Respiratory: Negative for cough, hemoptysis, shortness of breath and wheezing.   Cardiovascular: Negative for chest pain, palpitations, orthopnea and leg swelling.  Gastrointestinal: Negative for abdominal pain, blood in stool, constipation, diarrhea, heartburn, nausea and vomiting.  Genitourinary: Negative for dysuria, frequency and urgency.  Musculoskeletal: Negative for back pain and myalgias.  Skin: Negative for itching and rash.  Neurological: Negative for dizziness, tingling, tremors, focal weakness, seizures, weakness and headaches.  Psychiatric/Behavioral: Negative for depression. The patient is not nervous/anxious.    DRUG ALLERGIES:  No Known Allergies VITALS:  Blood pressure (!) 111/49, pulse 80, temperature 99 F (37.2 C), temperature source Oral, resp. rate 16, height 5\' 2"  (1.575 m), weight 49 kg, SpO2 98 %. PHYSICAL EXAMINATION:  Physical Exam  Constitutional: She is oriented to person, place, and time.  HENT:  Head: Normocephalic and atraumatic.  Eyes: Pupils are equal, round, and reactive to light. Conjunctivae and EOM are normal.  Neck: Normal range of motion. Neck supple. No tracheal deviation present. No thyromegaly present.  Cardiovascular: Normal rate, regular rhythm and normal heart sounds.  Pulmonary/Chest: Effort normal and breath sounds normal. No respiratory distress. She has no wheezes. She exhibits no tenderness.  Abdominal: Soft. Bowel sounds are normal.  She exhibits no distension. There is no tenderness.  Musculoskeletal: Normal range of motion.  Rt wrist in dressing  Neurological: She is alert and oriented to person, place, and time. No cranial nerve deficit.  Skin: Skin is warm and dry. No rash noted.   LABORATORY PANEL:  Female CBC Recent Labs  Lab 05/31/18 0643  WBC 12.5*  HGB 12.0  HCT 38.2  PLT 354   ------------------------------------------------------------------------------------------------------------------ Chemistries  Recent Labs  Lab 05/31/18 0643  NA 133*  K 4.3  CL 99  CO2 23  GLUCOSE 298*  BUN 27*  CREATININE 0.87  CALCIUM 8.8*   RADIOLOGY:  Dg Chest 1 View  Result Date: 05/31/2018 CLINICAL DATA:  67 year old female with planned right wrist surgery. Preoperative chest x-ray. EXAM: CHEST  1 VIEW COMPARISON:  Prior chest x-ray 01/22/2010 FINDINGS: The lungs are hyperinflated. There is diffuse mild interstitial prominence as well as chronic bronchitic changes. The overall picture is consistent with COPD. Biapical pleuroparenchymal scarring similar compared to prior. Cardiac and mediastinal contours are within normal limits. No focal airspace consolidation, pulmonary edema, pleural effusion or pneumothorax. No acute osseous abnormality. IMPRESSION: 1. No acute cardiopulmonary process. 2. Stable chronic pulmonary parenchymal changes consistent with a clinical history of COPD. Electronically Signed   By: Jacqulynn Cadet M.D.   On: 05/31/2018 08:46   Dg Wrist 2 Views Right  Result Date: 06/01/2018 CLINICAL DATA:  Operative fixation of a comminuted distal right radius fracture. EXAM: RIGHT WRIST - 2 VIEW COMPARISON:  Yesterday. FINDINGS: Interval screw and plate fixation of the previously demonstrated comminuted distal radius fracture with some residual radial displacement of the distal fragments. No angulation. IMPRESSION: Hardware fixation of the previously demonstrated comminuted distal radius fracture, as  described above. Electronically  Signed   By: Claudie Revering M.D.   On: 06/01/2018 00:48   ASSESSMENT AND PLAN:  1.  Comminuted Colles' fracture: s/p OPEN REDUCTION INTERNAL FIXATION (ORIF) DISTAL RADIAL FRACTURE (Right) POD 1. Doing well postop. Further mgmt per ortho  2.  Recent right ankle fracture status post immobilizer in the emergency room last Friday.  3.  Diabetes mellitus type 2: Continue sliding scale insulin with coverage, continue Lantus 10 units nightly as per diabetic coordinator recommendation plan to start Fiasp, Actos can be resumed at D/C  #4.  Essential hypertension: Controlled, continue HCTZ, lisinopril.   Medically stable. Can be D/Ced anytime ok per primary team.  All the records are reviewed and case discussed with Care Management/Social Worker. Management plans discussed with the patient, family and they are in agreement.  CODE STATUS: Full Code  TOTAL TIME TAKING CARE OF THIS PATIENT: 15 minutes.   More than 50% of the time was spent in counseling/coordination of care: Valaria Good M.D on 06/01/2018 at 6:40 AM  Between 7am to 6pm - Pager - [phone]  After 6pm go to [email or web address] - Proofreader  [facility]  Office  [phone]  CC: Primary care physician; [name], DO  Note: This dictation was prepared with Dragon dictation along with smaller phrase technology. Any transcriptional errors that result from this process are unintentional.

## 2018-06-01 NOTE — Evaluation (Addendum)
Physical Therapy Evaluation Patient Details Name: Michaela Walsh MRN: [id number] DOB: 1950-12-10 Today's Date: 06/01/2018   History of Present Illness  presented to ER secondary to mechanical fall in home environment, immediate onset of R wrist pain; admitted with comminuted colles fracture, s/p ORIF (12/9), NWB.  Of note, patient also with recent R distal fibular fracture (one week prior), now WBAT with boot per notes.  Clinical Impression  Upon evaluation, patient alert and oriented; follows commands.  Eager for [place] activities as tolerated.  Reports minimal pain in R UE/LE at this time.  R wrist and R ankle immobilized throughout session; R UE maintained NWB throughout.  Able to complete bed mobility with mod indep; sit/stand, basic transfers and gait (130' x2) with R PFRW; up/down 4 steps with single rail, cga/min assist (prefers ascending forward, descending backward).  Step to progressing to partially reciprocal stepping pattern with narrowed BOS, but no overt buckling or LOB noted.  Slow and cautious, good awareness of deficits and overall safety needs. Would benefit from skilled PT to address above deficits and promote optimal return to PLOF; Recommend transition to [place] upon discharge from acute hospitalization.     Follow Up Recommendations Home health PT    Equipment Recommendations  3in1 (PT)(R PFRW)    Recommendations for Other Services       Precautions / Restrictions Precautions Precautions: Fall Restrictions Weight Bearing Restrictions: Yes RUE Weight Bearing: Non weight bearing RLE Weight Bearing: Weight bearing as tolerated(with fracture boot)      Mobility  Bed Mobility Overal bed mobility: Modified Independent                Transfers Overall transfer level: Needs assistance Equipment used: Right platform walker Transfers: Sit to/from Stand Sit to Stand: Min guard         General transfer comment: cuing for hand placement to prevent pulling on  RW  Ambulation/Gait Ambulation/Gait assistance: Min guard Gait Distance (Feet): 130 Feet Assistive device: Right platform walker       General Gait Details: 3-point step to progressing towards partially reciprocal gait pattern; slow and cautious, but no overt buckling or LOB.  good tolerance for WBing R LE (no reports of pain increase) and good use of platform RW during session.  Stairs Stairs: Yes Stairs assistance: Min guard;Min assist Stair Management: One rail Left Number of Stairs: 4 General stair comments: prefers ascending forward, descending backward to allow use of L ascending rail with both directions; min cuing for technique.  Good LE strength/stability.  Wheelchair Mobility    Modified Rankin (Stroke Patients Only)       Balance Overall balance assessment: Needs assistance Sitting-balance support: No upper extremity supported;Feet supported Sitting balance-Leahy Scale: Good     Standing balance support: Bilateral upper extremity supported Standing balance-Leahy Scale: Fair                               Pertinent Vitals/Pain Pain Assessment: Faces Faces Pain Scale: Hurts a little bit Pain Location: R wrist Pain Descriptors / Indicators: Aching;Grimacing;Guarding Pain Intervention(s): Limited activity within patient's tolerance;Monitored during session;Repositioned    Home Living Family/patient expects to be discharged to:: Private residence Living Arrangements: Spouse/significant other Available Help at Discharge: Family Type of Home: House Home Access: Stairs to enter Entrance Stairs-Rails: Left Entrance Stairs-Number of Steps: 4 Home Layout: One level        Prior Function Level of Independence: Independent  Comments: Indep with ADLs, household and community mobilization without assist device; + driving, working full-time.  Recent use of bilat axillary crutches due to R distal fib fracture     Hand Dominance   Dominant  Hand: Right    Extremity/Trunk Assessment   Upper Extremity Assessment Upper Extremity Assessment: (R wrist immobilized; wiggles fingers, good elbow and shoulder strength and ROM.  L UE WFL)    Lower Extremity Assessment Lower Extremity Assessment: (R ankle in ace wrap (not tested for strength/ROM), immobilized in fracture boot for any/all WBing.  otherwise, LEs grossly WFL)       Communication   Communication: No difficulties  Cognition Arousal/Alertness: Awake/alert Behavior During Therapy: WFL for tasks assessed/performed Overall Cognitive Status: Within Functional Limits for tasks assessed                                        General Comments      Exercises Other Exercises Other Exercises: Additional 56' with R PFRW, cga/min assist--improving comfort/confidence with continued gait efforts   Assessment/Plan    PT Assessment Patient needs continued PT services  PT Problem List Decreased strength;Decreased range of motion;Decreased activity tolerance;Decreased balance;Decreased mobility;Decreased safety awareness;Decreased knowledge of precautions;Pain       PT Treatment Interventions DME instruction;Gait training;Stair training;Functional mobility training;Therapeutic activities;Therapeutic exercise;Patient/family education;Balance training    PT Goals (Current goals can be found in the Care Plan section)  Acute Rehab PT Goals Patient Stated Goal: to return home PT Goal Formulation: With patient Time For Goal Achievement: 06/15/18 Potential to Achieve Goals: Good    Frequency 7X/week   Barriers to discharge        Co-evaluation               AM-PAC PT "6 Clicks" Mobility  Outcome Measure Help needed turning from your back to your side while in a flat bed without using bedrails?: None Help needed moving from lying on your back to sitting on the side of a flat bed without using bedrails?: None Help needed moving to and from a bed to a  chair (including a wheelchair)?: A Little Help needed standing up from a chair using your arms (e.g., wheelchair or bedside chair)?: A Little Help needed to walk in hospital room?: A Little Help needed climbing 3-5 steps with a railing? : A Little 6 Click Score: 20    End of Session Equipment Utilized During Treatment: Gait belt Activity Tolerance: Patient tolerated treatment well Patient left: in bed;with call bell/phone within reach;with bed alarm set Nurse Communication: Mobility status PT Visit Diagnosis: Difficulty in walking, not elsewhere classified (R26.2);Repeated falls (R29.6);Muscle weakness (generalized) (M62.81);Pain Pain - Right/Left: Right Pain - part of body: Arm;Leg    Time: 1610-9604 PT Time Calculation (min) (ACUTE ONLY): 58 min   Charges:   PT Evaluation $PT Eval Moderate Complexity: 1 Mod PT Treatments $Gait Training: 8-22 mins $Therapeutic Activity: 8-22 mins        Charon Akamine H. Owens Shark, PT, DPT, NCS 06/01/18, 11:26 AM [phone]   Evaluation/treatment session was lead and completed by Belva Crome, SPT; directly observed, guided and documented by Tera Helper, PT.

## 2018-06-04 DIAGNOSIS — S52531D Colles' fracture of right radius, subsequent encounter for closed fracture with routine healing: Secondary | ICD-10-CM | POA: Diagnosis not present

## 2018-06-04 DIAGNOSIS — S52571D Other intraarticular fracture of lower end of right radius, subsequent encounter for closed fracture with routine healing: Secondary | ICD-10-CM | POA: Diagnosis not present

## 2018-06-10 DIAGNOSIS — S82831D Other fracture of upper and lower end of right fibula, subsequent encounter for closed fracture with routine healing: Secondary | ICD-10-CM | POA: Diagnosis not present

## 2018-07-09 DIAGNOSIS — Z8781 Personal history of (healed) traumatic fracture: Secondary | ICD-10-CM | POA: Diagnosis not present

## 2018-07-09 DIAGNOSIS — S82831D Other fracture of upper and lower end of right fibula, subsequent encounter for closed fracture with routine healing: Secondary | ICD-10-CM | POA: Diagnosis not present

## 2018-07-09 DIAGNOSIS — Z9889 Other specified postprocedural states: Secondary | ICD-10-CM | POA: Diagnosis not present

## 2018-07-27 DIAGNOSIS — E118 Type 2 diabetes mellitus with unspecified complications: Secondary | ICD-10-CM | POA: Diagnosis not present

## 2018-07-27 DIAGNOSIS — E789 Disorder of lipoprotein metabolism, unspecified: Secondary | ICD-10-CM | POA: Diagnosis not present

## 2018-08-02 DIAGNOSIS — E782 Mixed hyperlipidemia: Secondary | ICD-10-CM | POA: Diagnosis not present

## 2018-08-02 DIAGNOSIS — E139 Other specified diabetes mellitus without complications: Secondary | ICD-10-CM | POA: Diagnosis not present

## 2018-08-02 DIAGNOSIS — S82892A Other fracture of left lower leg, initial encounter for closed fracture: Secondary | ICD-10-CM | POA: Diagnosis not present

## 2018-08-02 DIAGNOSIS — E789 Disorder of lipoprotein metabolism, unspecified: Secondary | ICD-10-CM | POA: Diagnosis not present

## 2018-08-06 DIAGNOSIS — M65332 Trigger finger, left middle finger: Secondary | ICD-10-CM | POA: Diagnosis not present

## 2018-08-06 DIAGNOSIS — S82831A Other fracture of upper and lower end of right fibula, initial encounter for closed fracture: Secondary | ICD-10-CM | POA: Diagnosis not present

## 2018-08-06 DIAGNOSIS — Z9889 Other specified postprocedural states: Secondary | ICD-10-CM | POA: Diagnosis not present

## 2018-08-06 DIAGNOSIS — Z8781 Personal history of (healed) traumatic fracture: Secondary | ICD-10-CM | POA: Diagnosis not present

## 2018-08-10 DIAGNOSIS — Z01419 Encounter for gynecological examination (general) (routine) without abnormal findings: Secondary | ICD-10-CM | POA: Diagnosis not present

## 2018-08-10 DIAGNOSIS — Z6820 Body mass index (BMI) 20.0-20.9, adult: Secondary | ICD-10-CM | POA: Diagnosis not present

## 2018-08-10 DIAGNOSIS — Z1231 Encounter for screening mammogram for malignant neoplasm of breast: Secondary | ICD-10-CM | POA: Diagnosis not present

## 2018-08-10 DIAGNOSIS — N952 Postmenopausal atrophic vaginitis: Secondary | ICD-10-CM | POA: Diagnosis not present

## 2018-08-10 DIAGNOSIS — M810 Age-related osteoporosis without current pathological fracture: Secondary | ICD-10-CM | POA: Diagnosis not present

## 2018-11-01 DIAGNOSIS — W010XXA Fall on same level from slipping, tripping and stumbling without subsequent striking against object, initial encounter: Secondary | ICD-10-CM | POA: Diagnosis not present

## 2018-11-01 DIAGNOSIS — M25542 Pain in joints of left hand: Secondary | ICD-10-CM | POA: Diagnosis not present

## 2018-11-01 DIAGNOSIS — S52572A Other intraarticular fracture of lower end of left radius, initial encounter for closed fracture: Secondary | ICD-10-CM | POA: Diagnosis not present

## 2018-11-09 DIAGNOSIS — S62025A Nondisplaced fracture of middle third of navicular [scaphoid] bone of left wrist, initial encounter for closed fracture: Secondary | ICD-10-CM | POA: Diagnosis not present

## 2018-12-10 DIAGNOSIS — S62025D Nondisplaced fracture of middle third of navicular [scaphoid] bone of left wrist, subsequent encounter for fracture with routine healing: Secondary | ICD-10-CM | POA: Diagnosis not present

## 2018-12-31 ENCOUNTER — Other Ambulatory Visit: Payer: Self-pay | Admitting: Orthopedic Surgery

## 2018-12-31 DIAGNOSIS — S62025D Nondisplaced fracture of middle third of navicular [scaphoid] bone of left wrist, subsequent encounter for fracture with routine healing: Secondary | ICD-10-CM | POA: Diagnosis not present

## 2019-01-06 DIAGNOSIS — H52223 Regular astigmatism, bilateral: Secondary | ICD-10-CM | POA: Diagnosis not present

## 2019-01-06 DIAGNOSIS — H524 Presbyopia: Secondary | ICD-10-CM | POA: Diagnosis not present

## 2019-01-06 DIAGNOSIS — H5213 Myopia, bilateral: Secondary | ICD-10-CM | POA: Diagnosis not present

## 2019-01-06 DIAGNOSIS — M069 Rheumatoid arthritis, unspecified: Secondary | ICD-10-CM | POA: Diagnosis not present

## 2019-01-06 DIAGNOSIS — H25093 Other age-related incipient cataract, bilateral: Secondary | ICD-10-CM | POA: Diagnosis not present

## 2019-01-06 DIAGNOSIS — Z794 Long term (current) use of insulin: Secondary | ICD-10-CM | POA: Diagnosis not present

## 2019-01-06 DIAGNOSIS — E11311 Type 2 diabetes mellitus with unspecified diabetic retinopathy with macular edema: Secondary | ICD-10-CM | POA: Diagnosis not present

## 2019-01-12 ENCOUNTER — Other Ambulatory Visit: Payer: Self-pay

## 2019-01-12 ENCOUNTER — Encounter (INDEPENDENT_AMBULATORY_CARE_PROVIDER_SITE_OTHER): Payer: PPO | Admitting: Ophthalmology

## 2019-01-12 DIAGNOSIS — H2513 Age-related nuclear cataract, bilateral: Secondary | ICD-10-CM | POA: Diagnosis not present

## 2019-01-12 DIAGNOSIS — E113313 Type 2 diabetes mellitus with moderate nonproliferative diabetic retinopathy with macular edema, bilateral: Secondary | ICD-10-CM

## 2019-01-12 DIAGNOSIS — I10 Essential (primary) hypertension: Secondary | ICD-10-CM | POA: Diagnosis not present

## 2019-01-12 DIAGNOSIS — E11311 Type 2 diabetes mellitus with unspecified diabetic retinopathy with macular edema: Secondary | ICD-10-CM | POA: Diagnosis not present

## 2019-01-12 DIAGNOSIS — H43813 Vitreous degeneration, bilateral: Secondary | ICD-10-CM | POA: Diagnosis not present

## 2019-01-12 DIAGNOSIS — H35033 Hypertensive retinopathy, bilateral: Secondary | ICD-10-CM

## 2019-01-18 DIAGNOSIS — E139 Other specified diabetes mellitus without complications: Secondary | ICD-10-CM | POA: Diagnosis not present

## 2019-01-18 DIAGNOSIS — S82892A Other fracture of left lower leg, initial encounter for closed fracture: Secondary | ICD-10-CM | POA: Diagnosis not present

## 2019-01-18 DIAGNOSIS — E782 Mixed hyperlipidemia: Secondary | ICD-10-CM | POA: Diagnosis not present

## 2019-01-18 DIAGNOSIS — E789 Disorder of lipoprotein metabolism, unspecified: Secondary | ICD-10-CM | POA: Diagnosis not present

## 2019-01-18 DIAGNOSIS — E118 Type 2 diabetes mellitus with unspecified complications: Secondary | ICD-10-CM | POA: Diagnosis not present

## 2019-01-31 ENCOUNTER — Ambulatory Visit
Admission: RE | Admit: 2019-01-31 | Discharge: 2019-01-31 | Disposition: A | Discharge status: Home / Self Care | Payer: PPO | Source: Ambulatory Visit | Attending: Orthopedic Surgery | Admitting: Orthopedic Surgery

## 2019-01-31 ENCOUNTER — Other Ambulatory Visit: Payer: Self-pay

## 2019-01-31 DIAGNOSIS — S62002D Unspecified fracture of navicular [scaphoid] bone of left wrist, subsequent encounter for fracture with routine healing: Secondary | ICD-10-CM | POA: Diagnosis not present

## 2019-01-31 DIAGNOSIS — S62025D Nondisplaced fracture of middle third of navicular [scaphoid] bone of left wrist, subsequent encounter for fracture with routine healing: Secondary | ICD-10-CM | POA: Diagnosis not present

## 2019-02-10 ENCOUNTER — Other Ambulatory Visit: Payer: Self-pay

## 2019-02-10 ENCOUNTER — Encounter (INDEPENDENT_AMBULATORY_CARE_PROVIDER_SITE_OTHER): Payer: PPO | Admitting: Ophthalmology

## 2019-02-10 DIAGNOSIS — H2513 Age-related nuclear cataract, bilateral: Secondary | ICD-10-CM | POA: Diagnosis not present

## 2019-02-10 DIAGNOSIS — I10 Essential (primary) hypertension: Secondary | ICD-10-CM | POA: Diagnosis not present

## 2019-02-10 DIAGNOSIS — H35033 Hypertensive retinopathy, bilateral: Secondary | ICD-10-CM

## 2019-02-10 DIAGNOSIS — E11311 Type 2 diabetes mellitus with unspecified diabetic retinopathy with macular edema: Secondary | ICD-10-CM

## 2019-02-10 DIAGNOSIS — H43813 Vitreous degeneration, bilateral: Secondary | ICD-10-CM | POA: Diagnosis not present

## 2019-02-10 DIAGNOSIS — E113313 Type 2 diabetes mellitus with moderate nonproliferative diabetic retinopathy with macular edema, bilateral: Secondary | ICD-10-CM

## 2019-03-02 DIAGNOSIS — R0789 Other chest pain: Secondary | ICD-10-CM | POA: Diagnosis not present

## 2019-03-02 DIAGNOSIS — M419 Scoliosis, unspecified: Secondary | ICD-10-CM | POA: Diagnosis not present

## 2019-03-10 ENCOUNTER — Other Ambulatory Visit: Payer: Self-pay

## 2019-03-10 ENCOUNTER — Encounter (INDEPENDENT_AMBULATORY_CARE_PROVIDER_SITE_OTHER): Payer: PPO | Admitting: Ophthalmology

## 2019-03-10 DIAGNOSIS — E113313 Type 2 diabetes mellitus with moderate nonproliferative diabetic retinopathy with macular edema, bilateral: Secondary | ICD-10-CM

## 2019-03-10 DIAGNOSIS — H43813 Vitreous degeneration, bilateral: Secondary | ICD-10-CM

## 2019-03-10 DIAGNOSIS — I10 Essential (primary) hypertension: Secondary | ICD-10-CM

## 2019-03-10 DIAGNOSIS — H35033 Hypertensive retinopathy, bilateral: Secondary | ICD-10-CM

## 2019-03-10 DIAGNOSIS — H2513 Age-related nuclear cataract, bilateral: Secondary | ICD-10-CM | POA: Diagnosis not present

## 2019-03-10 DIAGNOSIS — E11311 Type 2 diabetes mellitus with unspecified diabetic retinopathy with macular edema: Secondary | ICD-10-CM

## 2019-04-07 ENCOUNTER — Encounter (INDEPENDENT_AMBULATORY_CARE_PROVIDER_SITE_OTHER): Payer: PPO | Admitting: Ophthalmology

## 2019-04-07 ENCOUNTER — Other Ambulatory Visit: Payer: Self-pay

## 2019-04-07 DIAGNOSIS — H43813 Vitreous degeneration, bilateral: Secondary | ICD-10-CM | POA: Diagnosis not present

## 2019-04-07 DIAGNOSIS — H35033 Hypertensive retinopathy, bilateral: Secondary | ICD-10-CM

## 2019-04-07 DIAGNOSIS — E11311 Type 2 diabetes mellitus with unspecified diabetic retinopathy with macular edema: Secondary | ICD-10-CM

## 2019-04-07 DIAGNOSIS — H2513 Age-related nuclear cataract, bilateral: Secondary | ICD-10-CM | POA: Diagnosis not present

## 2019-04-07 DIAGNOSIS — E113313 Type 2 diabetes mellitus with moderate nonproliferative diabetic retinopathy with macular edema, bilateral: Secondary | ICD-10-CM

## 2019-04-07 DIAGNOSIS — I10 Essential (primary) hypertension: Secondary | ICD-10-CM

## 2019-04-14 DIAGNOSIS — E118 Type 2 diabetes mellitus with unspecified complications: Secondary | ICD-10-CM | POA: Diagnosis not present

## 2019-04-21 DIAGNOSIS — E139 Other specified diabetes mellitus without complications: Secondary | ICD-10-CM | POA: Diagnosis not present

## 2019-04-21 DIAGNOSIS — E11319 Type 2 diabetes mellitus with unspecified diabetic retinopathy without macular edema: Secondary | ICD-10-CM | POA: Diagnosis not present

## 2019-04-21 DIAGNOSIS — E789 Disorder of lipoprotein metabolism, unspecified: Secondary | ICD-10-CM | POA: Diagnosis not present

## 2019-04-21 DIAGNOSIS — E782 Mixed hyperlipidemia: Secondary | ICD-10-CM | POA: Diagnosis not present

## 2019-04-21 DIAGNOSIS — S82892A Other fracture of left lower leg, initial encounter for closed fracture: Secondary | ICD-10-CM | POA: Diagnosis not present

## 2019-05-05 ENCOUNTER — Encounter (INDEPENDENT_AMBULATORY_CARE_PROVIDER_SITE_OTHER): Payer: PPO | Admitting: Ophthalmology

## 2019-05-05 ENCOUNTER — Other Ambulatory Visit: Payer: Self-pay

## 2019-05-05 DIAGNOSIS — I10 Essential (primary) hypertension: Secondary | ICD-10-CM

## 2019-05-05 DIAGNOSIS — H43813 Vitreous degeneration, bilateral: Secondary | ICD-10-CM | POA: Diagnosis not present

## 2019-05-05 DIAGNOSIS — H35033 Hypertensive retinopathy, bilateral: Secondary | ICD-10-CM

## 2019-05-05 DIAGNOSIS — E113313 Type 2 diabetes mellitus with moderate nonproliferative diabetic retinopathy with macular edema, bilateral: Secondary | ICD-10-CM | POA: Diagnosis not present

## 2019-05-05 DIAGNOSIS — E11311 Type 2 diabetes mellitus with unspecified diabetic retinopathy with macular edema: Secondary | ICD-10-CM | POA: Diagnosis not present

## 2019-05-16 ENCOUNTER — Other Ambulatory Visit: Payer: Self-pay

## 2019-05-16 DIAGNOSIS — Z20822 Contact with and (suspected) exposure to covid-19: Secondary | ICD-10-CM

## 2019-05-17 LAB — NOVEL CORONAVIRUS, NAA: SARS-CoV-2, NAA: NOT DETECTED

## 2019-06-02 ENCOUNTER — Other Ambulatory Visit: Payer: Self-pay

## 2019-06-02 ENCOUNTER — Encounter (INDEPENDENT_AMBULATORY_CARE_PROVIDER_SITE_OTHER): Payer: PPO | Admitting: Ophthalmology

## 2019-06-02 DIAGNOSIS — E113313 Type 2 diabetes mellitus with moderate nonproliferative diabetic retinopathy with macular edema, bilateral: Secondary | ICD-10-CM | POA: Diagnosis not present

## 2019-06-02 DIAGNOSIS — I10 Essential (primary) hypertension: Secondary | ICD-10-CM

## 2019-06-02 DIAGNOSIS — E11311 Type 2 diabetes mellitus with unspecified diabetic retinopathy with macular edema: Secondary | ICD-10-CM

## 2019-06-02 DIAGNOSIS — H2513 Age-related nuclear cataract, bilateral: Secondary | ICD-10-CM | POA: Diagnosis not present

## 2019-06-02 DIAGNOSIS — H43813 Vitreous degeneration, bilateral: Secondary | ICD-10-CM | POA: Diagnosis not present

## 2019-06-02 DIAGNOSIS — H35033 Hypertensive retinopathy, bilateral: Secondary | ICD-10-CM | POA: Diagnosis not present

## 2019-07-01 ENCOUNTER — Encounter (INDEPENDENT_AMBULATORY_CARE_PROVIDER_SITE_OTHER): Payer: PPO | Admitting: Ophthalmology

## 2019-07-01 DIAGNOSIS — H43813 Vitreous degeneration, bilateral: Secondary | ICD-10-CM

## 2019-07-01 DIAGNOSIS — H35033 Hypertensive retinopathy, bilateral: Secondary | ICD-10-CM

## 2019-07-01 DIAGNOSIS — I10 Essential (primary) hypertension: Secondary | ICD-10-CM

## 2019-07-01 DIAGNOSIS — E113313 Type 2 diabetes mellitus with moderate nonproliferative diabetic retinopathy with macular edema, bilateral: Secondary | ICD-10-CM

## 2019-07-01 DIAGNOSIS — E11311 Type 2 diabetes mellitus with unspecified diabetic retinopathy with macular edema: Secondary | ICD-10-CM

## 2019-07-19 DIAGNOSIS — E139 Other specified diabetes mellitus without complications: Secondary | ICD-10-CM | POA: Diagnosis not present

## 2019-07-19 DIAGNOSIS — E789 Disorder of lipoprotein metabolism, unspecified: Secondary | ICD-10-CM | POA: Diagnosis not present

## 2019-07-26 DIAGNOSIS — E139 Other specified diabetes mellitus without complications: Secondary | ICD-10-CM | POA: Diagnosis not present

## 2019-07-26 DIAGNOSIS — S82892A Other fracture of left lower leg, initial encounter for closed fracture: Secondary | ICD-10-CM | POA: Diagnosis not present

## 2019-07-26 DIAGNOSIS — E11319 Type 2 diabetes mellitus with unspecified diabetic retinopathy without macular edema: Secondary | ICD-10-CM | POA: Diagnosis not present

## 2019-07-26 DIAGNOSIS — E782 Mixed hyperlipidemia: Secondary | ICD-10-CM | POA: Diagnosis not present

## 2019-07-26 DIAGNOSIS — E789 Disorder of lipoprotein metabolism, unspecified: Secondary | ICD-10-CM | POA: Diagnosis not present

## 2019-07-29 ENCOUNTER — Encounter (INDEPENDENT_AMBULATORY_CARE_PROVIDER_SITE_OTHER): Payer: Medicare HMO | Admitting: Ophthalmology

## 2019-07-29 DIAGNOSIS — E11311 Type 2 diabetes mellitus with unspecified diabetic retinopathy with macular edema: Secondary | ICD-10-CM | POA: Diagnosis not present

## 2019-07-29 DIAGNOSIS — I10 Essential (primary) hypertension: Secondary | ICD-10-CM

## 2019-07-29 DIAGNOSIS — E113313 Type 2 diabetes mellitus with moderate nonproliferative diabetic retinopathy with macular edema, bilateral: Secondary | ICD-10-CM | POA: Diagnosis not present

## 2019-07-29 DIAGNOSIS — H43813 Vitreous degeneration, bilateral: Secondary | ICD-10-CM | POA: Diagnosis not present

## 2019-07-29 DIAGNOSIS — H2513 Age-related nuclear cataract, bilateral: Secondary | ICD-10-CM

## 2019-07-29 DIAGNOSIS — H35033 Hypertensive retinopathy, bilateral: Secondary | ICD-10-CM | POA: Diagnosis not present

## 2019-08-18 DIAGNOSIS — M816 Localized osteoporosis [Lequesne]: Secondary | ICD-10-CM | POA: Diagnosis not present

## 2019-08-18 DIAGNOSIS — Z6821 Body mass index (BMI) 21.0-21.9, adult: Secondary | ICD-10-CM | POA: Diagnosis not present

## 2019-08-18 DIAGNOSIS — N952 Postmenopausal atrophic vaginitis: Secondary | ICD-10-CM | POA: Diagnosis not present

## 2019-08-18 DIAGNOSIS — N958 Other specified menopausal and perimenopausal disorders: Secondary | ICD-10-CM | POA: Diagnosis not present

## 2019-08-18 DIAGNOSIS — Z01419 Encounter for gynecological examination (general) (routine) without abnormal findings: Secondary | ICD-10-CM | POA: Diagnosis not present

## 2019-08-18 DIAGNOSIS — Z1231 Encounter for screening mammogram for malignant neoplasm of breast: Secondary | ICD-10-CM | POA: Diagnosis not present

## 2019-08-26 ENCOUNTER — Encounter (INDEPENDENT_AMBULATORY_CARE_PROVIDER_SITE_OTHER): Payer: Medicare HMO | Admitting: Ophthalmology

## 2019-08-26 DIAGNOSIS — H35033 Hypertensive retinopathy, bilateral: Secondary | ICD-10-CM | POA: Diagnosis not present

## 2019-08-26 DIAGNOSIS — E113313 Type 2 diabetes mellitus with moderate nonproliferative diabetic retinopathy with macular edema, bilateral: Secondary | ICD-10-CM | POA: Diagnosis not present

## 2019-08-26 DIAGNOSIS — I10 Essential (primary) hypertension: Secondary | ICD-10-CM

## 2019-08-26 DIAGNOSIS — H43813 Vitreous degeneration, bilateral: Secondary | ICD-10-CM

## 2019-08-26 DIAGNOSIS — E11311 Type 2 diabetes mellitus with unspecified diabetic retinopathy with macular edema: Secondary | ICD-10-CM

## 2019-09-30 ENCOUNTER — Encounter (INDEPENDENT_AMBULATORY_CARE_PROVIDER_SITE_OTHER): Payer: Medicare HMO | Admitting: Ophthalmology

## 2019-09-30 DIAGNOSIS — H35033 Hypertensive retinopathy, bilateral: Secondary | ICD-10-CM | POA: Diagnosis not present

## 2019-09-30 DIAGNOSIS — E11311 Type 2 diabetes mellitus with unspecified diabetic retinopathy with macular edema: Secondary | ICD-10-CM | POA: Diagnosis not present

## 2019-09-30 DIAGNOSIS — I10 Essential (primary) hypertension: Secondary | ICD-10-CM

## 2019-09-30 DIAGNOSIS — H43813 Vitreous degeneration, bilateral: Secondary | ICD-10-CM | POA: Diagnosis not present

## 2019-09-30 DIAGNOSIS — E113313 Type 2 diabetes mellitus with moderate nonproliferative diabetic retinopathy with macular edema, bilateral: Secondary | ICD-10-CM

## 2019-10-13 DIAGNOSIS — G4700 Insomnia, unspecified: Secondary | ICD-10-CM | POA: Diagnosis not present

## 2019-10-13 DIAGNOSIS — Z0000 Encounter for general adult medical examination without abnormal findings: Secondary | ICD-10-CM | POA: Diagnosis not present

## 2019-10-19 DIAGNOSIS — E782 Mixed hyperlipidemia: Secondary | ICD-10-CM | POA: Diagnosis not present

## 2019-10-19 DIAGNOSIS — E118 Type 2 diabetes mellitus with unspecified complications: Secondary | ICD-10-CM | POA: Diagnosis not present

## 2019-10-26 DIAGNOSIS — E789 Disorder of lipoprotein metabolism, unspecified: Secondary | ICD-10-CM | POA: Diagnosis not present

## 2019-10-26 DIAGNOSIS — E11319 Type 2 diabetes mellitus with unspecified diabetic retinopathy without macular edema: Secondary | ICD-10-CM | POA: Diagnosis not present

## 2019-10-26 DIAGNOSIS — E782 Mixed hyperlipidemia: Secondary | ICD-10-CM | POA: Diagnosis not present

## 2019-10-26 DIAGNOSIS — S82892A Other fracture of left lower leg, initial encounter for closed fracture: Secondary | ICD-10-CM | POA: Diagnosis not present

## 2019-10-26 DIAGNOSIS — E139 Other specified diabetes mellitus without complications: Secondary | ICD-10-CM | POA: Diagnosis not present

## 2019-10-26 DIAGNOSIS — E118 Type 2 diabetes mellitus with unspecified complications: Secondary | ICD-10-CM | POA: Diagnosis not present

## 2019-10-28 ENCOUNTER — Encounter (INDEPENDENT_AMBULATORY_CARE_PROVIDER_SITE_OTHER): Payer: Medicare HMO | Admitting: Ophthalmology

## 2019-10-28 DIAGNOSIS — I10 Essential (primary) hypertension: Secondary | ICD-10-CM

## 2019-10-28 DIAGNOSIS — E11311 Type 2 diabetes mellitus with unspecified diabetic retinopathy with macular edema: Secondary | ICD-10-CM | POA: Diagnosis not present

## 2019-10-28 DIAGNOSIS — H43813 Vitreous degeneration, bilateral: Secondary | ICD-10-CM

## 2019-10-28 DIAGNOSIS — H35033 Hypertensive retinopathy, bilateral: Secondary | ICD-10-CM

## 2019-10-28 DIAGNOSIS — E113313 Type 2 diabetes mellitus with moderate nonproliferative diabetic retinopathy with macular edema, bilateral: Secondary | ICD-10-CM | POA: Diagnosis not present

## 2019-11-25 ENCOUNTER — Other Ambulatory Visit: Payer: Self-pay

## 2019-11-25 ENCOUNTER — Encounter (INDEPENDENT_AMBULATORY_CARE_PROVIDER_SITE_OTHER): Payer: Medicare HMO | Admitting: Ophthalmology

## 2019-11-25 DIAGNOSIS — E11311 Type 2 diabetes mellitus with unspecified diabetic retinopathy with macular edema: Secondary | ICD-10-CM

## 2019-11-25 DIAGNOSIS — E113313 Type 2 diabetes mellitus with moderate nonproliferative diabetic retinopathy with macular edema, bilateral: Secondary | ICD-10-CM | POA: Diagnosis not present

## 2019-11-25 DIAGNOSIS — I10 Essential (primary) hypertension: Secondary | ICD-10-CM

## 2019-11-25 DIAGNOSIS — H43813 Vitreous degeneration, bilateral: Secondary | ICD-10-CM | POA: Diagnosis not present

## 2019-11-25 DIAGNOSIS — H35033 Hypertensive retinopathy, bilateral: Secondary | ICD-10-CM

## 2019-12-23 ENCOUNTER — Encounter (INDEPENDENT_AMBULATORY_CARE_PROVIDER_SITE_OTHER): Payer: Medicare HMO | Admitting: Ophthalmology

## 2019-12-23 ENCOUNTER — Other Ambulatory Visit: Payer: Self-pay

## 2019-12-23 DIAGNOSIS — H35033 Hypertensive retinopathy, bilateral: Secondary | ICD-10-CM | POA: Diagnosis not present

## 2019-12-23 DIAGNOSIS — E113313 Type 2 diabetes mellitus with moderate nonproliferative diabetic retinopathy with macular edema, bilateral: Secondary | ICD-10-CM

## 2019-12-23 DIAGNOSIS — H43813 Vitreous degeneration, bilateral: Secondary | ICD-10-CM | POA: Diagnosis not present

## 2019-12-23 DIAGNOSIS — I10 Essential (primary) hypertension: Secondary | ICD-10-CM | POA: Diagnosis not present

## 2019-12-23 DIAGNOSIS — H2513 Age-related nuclear cataract, bilateral: Secondary | ICD-10-CM

## 2019-12-23 DIAGNOSIS — E11311 Type 2 diabetes mellitus with unspecified diabetic retinopathy with macular edema: Secondary | ICD-10-CM

## 2020-01-20 ENCOUNTER — Encounter (INDEPENDENT_AMBULATORY_CARE_PROVIDER_SITE_OTHER): Payer: Medicare HMO | Admitting: Ophthalmology

## 2020-01-20 ENCOUNTER — Other Ambulatory Visit: Payer: Self-pay

## 2020-01-20 DIAGNOSIS — I10 Essential (primary) hypertension: Secondary | ICD-10-CM | POA: Diagnosis not present

## 2020-01-20 DIAGNOSIS — E11311 Type 2 diabetes mellitus with unspecified diabetic retinopathy with macular edema: Secondary | ICD-10-CM

## 2020-01-20 DIAGNOSIS — H43813 Vitreous degeneration, bilateral: Secondary | ICD-10-CM

## 2020-01-20 DIAGNOSIS — H35033 Hypertensive retinopathy, bilateral: Secondary | ICD-10-CM | POA: Diagnosis not present

## 2020-01-20 DIAGNOSIS — E113313 Type 2 diabetes mellitus with moderate nonproliferative diabetic retinopathy with macular edema, bilateral: Secondary | ICD-10-CM | POA: Diagnosis not present

## 2020-02-17 ENCOUNTER — Encounter (INDEPENDENT_AMBULATORY_CARE_PROVIDER_SITE_OTHER): Payer: Medicare HMO | Admitting: Ophthalmology

## 2020-02-17 ENCOUNTER — Other Ambulatory Visit: Payer: Self-pay

## 2020-02-17 DIAGNOSIS — H35033 Hypertensive retinopathy, bilateral: Secondary | ICD-10-CM

## 2020-02-17 DIAGNOSIS — H43813 Vitreous degeneration, bilateral: Secondary | ICD-10-CM

## 2020-02-17 DIAGNOSIS — E11311 Type 2 diabetes mellitus with unspecified diabetic retinopathy with macular edema: Secondary | ICD-10-CM | POA: Diagnosis not present

## 2020-02-17 DIAGNOSIS — I10 Essential (primary) hypertension: Secondary | ICD-10-CM | POA: Diagnosis not present

## 2020-02-17 DIAGNOSIS — E113313 Type 2 diabetes mellitus with moderate nonproliferative diabetic retinopathy with macular edema, bilateral: Secondary | ICD-10-CM

## 2020-02-17 DIAGNOSIS — H2513 Age-related nuclear cataract, bilateral: Secondary | ICD-10-CM | POA: Diagnosis not present

## 2020-02-28 DIAGNOSIS — E789 Disorder of lipoprotein metabolism, unspecified: Secondary | ICD-10-CM | POA: Diagnosis not present

## 2020-02-28 DIAGNOSIS — E139 Other specified diabetes mellitus without complications: Secondary | ICD-10-CM | POA: Diagnosis not present

## 2020-03-05 DIAGNOSIS — S82892A Other fracture of left lower leg, initial encounter for closed fracture: Secondary | ICD-10-CM | POA: Diagnosis not present

## 2020-03-05 DIAGNOSIS — E782 Mixed hyperlipidemia: Secondary | ICD-10-CM | POA: Diagnosis not present

## 2020-03-05 DIAGNOSIS — E789 Disorder of lipoprotein metabolism, unspecified: Secondary | ICD-10-CM | POA: Diagnosis not present

## 2020-03-05 DIAGNOSIS — E139 Other specified diabetes mellitus without complications: Secondary | ICD-10-CM | POA: Diagnosis not present

## 2020-03-05 DIAGNOSIS — E11319 Type 2 diabetes mellitus with unspecified diabetic retinopathy without macular edema: Secondary | ICD-10-CM | POA: Diagnosis not present

## 2020-03-16 ENCOUNTER — Other Ambulatory Visit: Payer: Self-pay

## 2020-03-16 ENCOUNTER — Encounter (INDEPENDENT_AMBULATORY_CARE_PROVIDER_SITE_OTHER): Payer: Medicare HMO | Admitting: Ophthalmology

## 2020-03-16 DIAGNOSIS — I10 Essential (primary) hypertension: Secondary | ICD-10-CM

## 2020-03-16 DIAGNOSIS — E11311 Type 2 diabetes mellitus with unspecified diabetic retinopathy with macular edema: Secondary | ICD-10-CM

## 2020-03-16 DIAGNOSIS — H35033 Hypertensive retinopathy, bilateral: Secondary | ICD-10-CM | POA: Diagnosis not present

## 2020-03-16 DIAGNOSIS — E113312 Type 2 diabetes mellitus with moderate nonproliferative diabetic retinopathy with macular edema, left eye: Secondary | ICD-10-CM

## 2020-03-16 DIAGNOSIS — E113211 Type 2 diabetes mellitus with mild nonproliferative diabetic retinopathy with macular edema, right eye: Secondary | ICD-10-CM

## 2020-03-16 DIAGNOSIS — H43813 Vitreous degeneration, bilateral: Secondary | ICD-10-CM | POA: Diagnosis not present

## 2020-03-22 DIAGNOSIS — Z03818 Encounter for observation for suspected exposure to other biological agents ruled out: Secondary | ICD-10-CM | POA: Diagnosis not present

## 2020-03-22 DIAGNOSIS — Z20822 Contact with and (suspected) exposure to covid-19: Secondary | ICD-10-CM | POA: Diagnosis not present

## 2020-03-23 ENCOUNTER — Other Ambulatory Visit: Payer: Self-pay | Admitting: Physician Assistant

## 2020-03-23 DIAGNOSIS — E1069 Type 1 diabetes mellitus with other specified complication: Secondary | ICD-10-CM

## 2020-03-23 DIAGNOSIS — I10 Essential (primary) hypertension: Secondary | ICD-10-CM

## 2020-03-23 DIAGNOSIS — U071 COVID-19: Secondary | ICD-10-CM

## 2020-03-23 DIAGNOSIS — F17200 Nicotine dependence, unspecified, uncomplicated: Secondary | ICD-10-CM

## 2020-03-23 NOTE — Progress Notes (Signed)
I connected by phone with Michaela Walsh on 03/23/2020 at 4:20 PM to discuss the potential use of a new treatment for mild to moderate COVID-19 viral infection in non-hospitalized patients.  This patient is a 69 y.o. female that meets the FDA criteria for Emergency Use Authorization of COVID monoclonal antibody casirivimab/imdevimab or bamlanivimab/eteseviamb.  Has a (+) direct SARS-CoV-2 viral test result  Has mild or moderate COVID-19   Is NOT hospitalized due to COVID-19  Is within 10 days of symptom onset  Has at least one of the high risk factor(s) for progression to severe COVID-19 and/or hospitalization as defined in EUA.  Specific high risk criteria : Older age (>/= 69 yo), Diabetes, Cardiovascular disease or hypertension and Chronic Lung Disease   I have spoken and communicated the following to the patient or parent/caregiver regarding COVID monoclonal antibody treatment:  1. FDA has authorized the emergency use for the treatment of mild to moderate COVID-19 in adults and pediatric patients with positive results of direct SARS-CoV-2 viral testing who are 83 years of age and older weighing at least 40 kg, and who are at high risk for progressing to severe COVID-19 and/or hospitalization.  2. The significant known and potential risks and benefits of COVID monoclonal antibody, and the extent to which such potential risks and benefits are unknown.  3. Information on available alternative treatments and the risks and benefits of those alternatives, including clinical trials.  4. Patients treated with COVID monoclonal antibody should continue to self-isolate and use infection control measures (e.g., wear mask, isolate, social distance, avoid sharing personal items, clean and disinfect "high touch" surfaces, and frequent handwashing) according to CDC guidelines.   5. The patient or parent/caregiver has the option to accept or refuse COVID monoclonal antibody treatment.  After reviewing  this information with the patient, the patient has agreed to receive one of the available covid 19 monoclonal antibodies and will be provided an appropriate fact sheet prior to infusion. [place], [place] 03/23/2020 4:20 PM

## 2020-03-24 ENCOUNTER — Ambulatory Visit (HOSPITAL_COMMUNITY)
Admission: RE | Admit: 2020-03-24 | Discharge: 2020-03-24 | Disposition: A | Discharge status: Home / Self Care | Payer: Medicare Other | Source: Ambulatory Visit | Attending: Pulmonary Disease | Admitting: Pulmonary Disease

## 2020-03-24 ENCOUNTER — Other Ambulatory Visit (HOSPITAL_COMMUNITY): Payer: Self-pay

## 2020-03-24 DIAGNOSIS — F17200 Nicotine dependence, unspecified, uncomplicated: Secondary | ICD-10-CM | POA: Diagnosis present

## 2020-03-24 DIAGNOSIS — I10 Essential (primary) hypertension: Secondary | ICD-10-CM | POA: Insufficient documentation

## 2020-03-24 DIAGNOSIS — E1069 Type 1 diabetes mellitus with other specified complication: Secondary | ICD-10-CM | POA: Insufficient documentation

## 2020-03-24 DIAGNOSIS — Z23 Encounter for immunization: Secondary | ICD-10-CM | POA: Diagnosis not present

## 2020-03-24 DIAGNOSIS — U071 COVID-19: Secondary | ICD-10-CM | POA: Diagnosis present

## 2020-03-24 MED ORDER — EPINEPHRINE 0.3 MG/0.3ML IJ SOAJ: 0.3000 mg | Freq: Once | INTRAMUSCULAR | Status: DC | PRN

## 2020-03-24 MED ORDER — SODIUM CHLORIDE 0.9 % IV SOLN
1200.0000 mg | Freq: Once | INTRAVENOUS | Status: AC
  Administered 2020-03-24: 1200 mg via INTRAVENOUS
  Filled 2020-03-24: qty 10

## 2020-03-24 MED ORDER — ALBUTEROL SULFATE HFA 108 (90 BASE) MCG/ACT IN AERS: 2.0000 | INHALATION_SPRAY | Freq: Once | RESPIRATORY_TRACT | Status: DC | PRN

## 2020-03-24 MED ORDER — FAMOTIDINE IN NACL 20-0.9 MG/50ML-% IV SOLN: 20.0000 mg | Freq: Once | INTRAVENOUS | Status: DC | PRN

## 2020-03-24 MED ORDER — DIPHENHYDRAMINE HCL 50 MG/ML IJ SOLN: 50.0000 mg | Freq: Once | INTRAMUSCULAR | Status: DC | PRN

## 2020-03-24 MED ORDER — METHYLPREDNISOLONE SODIUM SUCC 125 MG IJ SOLR: 125.0000 mg | Freq: Once | INTRAMUSCULAR | Status: DC | PRN

## 2020-03-24 MED ORDER — SODIUM CHLORIDE 0.9 % IV SOLN: INTRAVENOUS | Status: DC | PRN

## 2020-03-24 NOTE — Progress Notes (Addendum)
  Diagnosis: COVID-19  Physician: Dr. Joya Gaskins  Procedure: Covid Infusion Clinic Med: casirivimab\imdevimab infusion - Provided patient with casirivimab\imdevimab fact sheet for patients, parents and caregivers prior to infusion.  Complications: No immediate complications noted.  Discharge: Discharged home   Michaela Walsh 03/24/2020

## 2020-03-24 NOTE — Discharge Instructions (Signed)

## 2020-04-02 DIAGNOSIS — Z03818 Encounter for observation for suspected exposure to other biological agents ruled out: Secondary | ICD-10-CM | POA: Diagnosis not present

## 2020-04-02 DIAGNOSIS — Z20822 Contact with and (suspected) exposure to covid-19: Secondary | ICD-10-CM | POA: Diagnosis not present

## 2020-04-11 ENCOUNTER — Other Ambulatory Visit: Payer: Self-pay

## 2020-04-11 ENCOUNTER — Encounter (INDEPENDENT_AMBULATORY_CARE_PROVIDER_SITE_OTHER): Payer: Medicare HMO | Admitting: Ophthalmology

## 2020-04-11 DIAGNOSIS — H35033 Hypertensive retinopathy, bilateral: Secondary | ICD-10-CM

## 2020-04-11 DIAGNOSIS — E11311 Type 2 diabetes mellitus with unspecified diabetic retinopathy with macular edema: Secondary | ICD-10-CM | POA: Diagnosis not present

## 2020-04-11 DIAGNOSIS — I10 Essential (primary) hypertension: Secondary | ICD-10-CM

## 2020-04-11 DIAGNOSIS — H43813 Vitreous degeneration, bilateral: Secondary | ICD-10-CM | POA: Diagnosis not present

## 2020-04-11 DIAGNOSIS — E113313 Type 2 diabetes mellitus with moderate nonproliferative diabetic retinopathy with macular edema, bilateral: Secondary | ICD-10-CM

## 2020-05-11 ENCOUNTER — Encounter (INDEPENDENT_AMBULATORY_CARE_PROVIDER_SITE_OTHER): Payer: Medicare HMO | Admitting: Ophthalmology

## 2020-05-11 ENCOUNTER — Other Ambulatory Visit: Payer: Self-pay

## 2020-05-11 DIAGNOSIS — H35033 Hypertensive retinopathy, bilateral: Secondary | ICD-10-CM | POA: Diagnosis not present

## 2020-05-11 DIAGNOSIS — H43813 Vitreous degeneration, bilateral: Secondary | ICD-10-CM

## 2020-05-11 DIAGNOSIS — I10 Essential (primary) hypertension: Secondary | ICD-10-CM

## 2020-05-11 DIAGNOSIS — E113312 Type 2 diabetes mellitus with moderate nonproliferative diabetic retinopathy with macular edema, left eye: Secondary | ICD-10-CM | POA: Diagnosis not present

## 2020-05-11 DIAGNOSIS — E113211 Type 2 diabetes mellitus with mild nonproliferative diabetic retinopathy with macular edema, right eye: Secondary | ICD-10-CM

## 2020-05-11 DIAGNOSIS — E11311 Type 2 diabetes mellitus with unspecified diabetic retinopathy with macular edema: Secondary | ICD-10-CM | POA: Diagnosis not present

## 2020-06-08 ENCOUNTER — Other Ambulatory Visit: Payer: Self-pay

## 2020-06-08 ENCOUNTER — Encounter (INDEPENDENT_AMBULATORY_CARE_PROVIDER_SITE_OTHER): Payer: Medicare HMO | Admitting: Ophthalmology

## 2020-06-08 DIAGNOSIS — E113211 Type 2 diabetes mellitus with mild nonproliferative diabetic retinopathy with macular edema, right eye: Secondary | ICD-10-CM

## 2020-06-08 DIAGNOSIS — H43813 Vitreous degeneration, bilateral: Secondary | ICD-10-CM | POA: Diagnosis not present

## 2020-06-08 DIAGNOSIS — H35033 Hypertensive retinopathy, bilateral: Secondary | ICD-10-CM

## 2020-06-08 DIAGNOSIS — I10 Essential (primary) hypertension: Secondary | ICD-10-CM | POA: Diagnosis not present

## 2020-06-08 DIAGNOSIS — E113312 Type 2 diabetes mellitus with moderate nonproliferative diabetic retinopathy with macular edema, left eye: Secondary | ICD-10-CM | POA: Diagnosis not present

## 2020-07-06 ENCOUNTER — Encounter (INDEPENDENT_AMBULATORY_CARE_PROVIDER_SITE_OTHER): Payer: HMO | Admitting: Ophthalmology

## 2020-07-06 ENCOUNTER — Other Ambulatory Visit: Payer: Self-pay

## 2020-07-06 DIAGNOSIS — E113313 Type 2 diabetes mellitus with moderate nonproliferative diabetic retinopathy with macular edema, bilateral: Secondary | ICD-10-CM | POA: Diagnosis not present

## 2020-07-06 DIAGNOSIS — H43813 Vitreous degeneration, bilateral: Secondary | ICD-10-CM | POA: Diagnosis not present

## 2020-07-06 DIAGNOSIS — H35033 Hypertensive retinopathy, bilateral: Secondary | ICD-10-CM | POA: Diagnosis not present

## 2020-07-06 DIAGNOSIS — I10 Essential (primary) hypertension: Secondary | ICD-10-CM

## 2020-08-01 ENCOUNTER — Encounter (INDEPENDENT_AMBULATORY_CARE_PROVIDER_SITE_OTHER): Payer: HMO | Admitting: Ophthalmology

## 2020-08-01 ENCOUNTER — Other Ambulatory Visit: Payer: Self-pay

## 2020-08-01 DIAGNOSIS — H43813 Vitreous degeneration, bilateral: Secondary | ICD-10-CM | POA: Diagnosis not present

## 2020-08-01 DIAGNOSIS — H35033 Hypertensive retinopathy, bilateral: Secondary | ICD-10-CM

## 2020-08-01 DIAGNOSIS — I10 Essential (primary) hypertension: Secondary | ICD-10-CM

## 2020-08-01 DIAGNOSIS — E113313 Type 2 diabetes mellitus with moderate nonproliferative diabetic retinopathy with macular edema, bilateral: Secondary | ICD-10-CM

## 2020-08-08 ENCOUNTER — Encounter (INDEPENDENT_AMBULATORY_CARE_PROVIDER_SITE_OTHER): Payer: HMO | Admitting: Ophthalmology

## 2020-08-10 ENCOUNTER — Encounter (INDEPENDENT_AMBULATORY_CARE_PROVIDER_SITE_OTHER): Payer: HMO | Admitting: Ophthalmology

## 2020-08-10 ENCOUNTER — Other Ambulatory Visit: Payer: Self-pay

## 2020-08-10 DIAGNOSIS — E113313 Type 2 diabetes mellitus with moderate nonproliferative diabetic retinopathy with macular edema, bilateral: Secondary | ICD-10-CM

## 2020-08-27 DIAGNOSIS — E789 Disorder of lipoprotein metabolism, unspecified: Secondary | ICD-10-CM | POA: Diagnosis not present

## 2020-08-27 DIAGNOSIS — E118 Type 2 diabetes mellitus with unspecified complications: Secondary | ICD-10-CM | POA: Diagnosis not present

## 2020-09-03 DIAGNOSIS — S82892A Other fracture of left lower leg, initial encounter for closed fracture: Secondary | ICD-10-CM | POA: Diagnosis not present

## 2020-09-03 DIAGNOSIS — E782 Mixed hyperlipidemia: Secondary | ICD-10-CM | POA: Diagnosis not present

## 2020-09-03 DIAGNOSIS — E118 Type 2 diabetes mellitus with unspecified complications: Secondary | ICD-10-CM | POA: Diagnosis not present

## 2020-09-03 DIAGNOSIS — E789 Disorder of lipoprotein metabolism, unspecified: Secondary | ICD-10-CM | POA: Diagnosis not present

## 2020-09-03 DIAGNOSIS — E139 Other specified diabetes mellitus without complications: Secondary | ICD-10-CM | POA: Diagnosis not present

## 2020-09-07 ENCOUNTER — Encounter (INDEPENDENT_AMBULATORY_CARE_PROVIDER_SITE_OTHER): Payer: HMO | Admitting: Ophthalmology

## 2020-09-07 ENCOUNTER — Other Ambulatory Visit: Payer: Self-pay

## 2020-09-07 DIAGNOSIS — E113313 Type 2 diabetes mellitus with moderate nonproliferative diabetic retinopathy with macular edema, bilateral: Secondary | ICD-10-CM | POA: Diagnosis not present

## 2020-09-07 DIAGNOSIS — H35033 Hypertensive retinopathy, bilateral: Secondary | ICD-10-CM

## 2020-09-07 DIAGNOSIS — H43813 Vitreous degeneration, bilateral: Secondary | ICD-10-CM | POA: Diagnosis not present

## 2020-09-07 DIAGNOSIS — I10 Essential (primary) hypertension: Secondary | ICD-10-CM | POA: Diagnosis not present

## 2020-09-18 DIAGNOSIS — Z1231 Encounter for screening mammogram for malignant neoplasm of breast: Secondary | ICD-10-CM | POA: Diagnosis not present

## 2020-09-18 DIAGNOSIS — M810 Age-related osteoporosis without current pathological fracture: Secondary | ICD-10-CM | POA: Diagnosis not present

## 2020-09-18 DIAGNOSIS — Z124 Encounter for screening for malignant neoplasm of cervix: Secondary | ICD-10-CM | POA: Diagnosis not present

## 2020-09-18 DIAGNOSIS — N952 Postmenopausal atrophic vaginitis: Secondary | ICD-10-CM | POA: Diagnosis not present

## 2020-09-18 DIAGNOSIS — Z6822 Body mass index (BMI) 22.0-22.9, adult: Secondary | ICD-10-CM | POA: Diagnosis not present

## 2020-09-24 ENCOUNTER — Encounter (INDEPENDENT_AMBULATORY_CARE_PROVIDER_SITE_OTHER): Payer: HMO | Admitting: Ophthalmology

## 2020-09-24 ENCOUNTER — Other Ambulatory Visit: Payer: Self-pay

## 2020-09-24 DIAGNOSIS — E113312 Type 2 diabetes mellitus with moderate nonproliferative diabetic retinopathy with macular edema, left eye: Secondary | ICD-10-CM

## 2020-10-05 ENCOUNTER — Other Ambulatory Visit: Payer: Self-pay

## 2020-10-05 ENCOUNTER — Encounter (INDEPENDENT_AMBULATORY_CARE_PROVIDER_SITE_OTHER): Payer: HMO | Admitting: Ophthalmology

## 2020-10-05 DIAGNOSIS — H35033 Hypertensive retinopathy, bilateral: Secondary | ICD-10-CM | POA: Diagnosis not present

## 2020-10-05 DIAGNOSIS — E113313 Type 2 diabetes mellitus with moderate nonproliferative diabetic retinopathy with macular edema, bilateral: Secondary | ICD-10-CM

## 2020-10-05 DIAGNOSIS — I10 Essential (primary) hypertension: Secondary | ICD-10-CM

## 2020-10-05 DIAGNOSIS — H43813 Vitreous degeneration, bilateral: Secondary | ICD-10-CM | POA: Diagnosis not present

## 2020-10-15 DIAGNOSIS — E139 Other specified diabetes mellitus without complications: Secondary | ICD-10-CM | POA: Diagnosis not present

## 2020-10-15 DIAGNOSIS — I10 Essential (primary) hypertension: Secondary | ICD-10-CM | POA: Diagnosis not present

## 2020-10-15 DIAGNOSIS — Z23 Encounter for immunization: Secondary | ICD-10-CM | POA: Diagnosis not present

## 2020-10-15 DIAGNOSIS — K219 Gastro-esophageal reflux disease without esophagitis: Secondary | ICD-10-CM | POA: Diagnosis not present

## 2020-10-15 DIAGNOSIS — Z79899 Other long term (current) drug therapy: Secondary | ICD-10-CM | POA: Diagnosis not present

## 2020-10-15 DIAGNOSIS — Z0000 Encounter for general adult medical examination without abnormal findings: Secondary | ICD-10-CM | POA: Diagnosis not present

## 2020-10-15 DIAGNOSIS — E7800 Pure hypercholesterolemia, unspecified: Secondary | ICD-10-CM | POA: Diagnosis not present

## 2020-10-15 DIAGNOSIS — G4700 Insomnia, unspecified: Secondary | ICD-10-CM | POA: Diagnosis not present

## 2020-10-15 DIAGNOSIS — M419 Scoliosis, unspecified: Secondary | ICD-10-CM | POA: Diagnosis not present

## 2020-11-02 ENCOUNTER — Encounter (INDEPENDENT_AMBULATORY_CARE_PROVIDER_SITE_OTHER): Payer: HMO | Admitting: Ophthalmology

## 2020-11-02 ENCOUNTER — Other Ambulatory Visit: Payer: Self-pay

## 2020-11-02 DIAGNOSIS — E113313 Type 2 diabetes mellitus with moderate nonproliferative diabetic retinopathy with macular edema, bilateral: Secondary | ICD-10-CM

## 2020-11-02 DIAGNOSIS — H43813 Vitreous degeneration, bilateral: Secondary | ICD-10-CM

## 2020-11-02 DIAGNOSIS — H35033 Hypertensive retinopathy, bilateral: Secondary | ICD-10-CM

## 2020-11-02 DIAGNOSIS — I10 Essential (primary) hypertension: Secondary | ICD-10-CM

## 2020-11-12 DIAGNOSIS — H2512 Age-related nuclear cataract, left eye: Secondary | ICD-10-CM | POA: Diagnosis not present

## 2020-11-12 DIAGNOSIS — H35033 Hypertensive retinopathy, bilateral: Secondary | ICD-10-CM | POA: Diagnosis not present

## 2020-11-12 DIAGNOSIS — H2513 Age-related nuclear cataract, bilateral: Secondary | ICD-10-CM | POA: Diagnosis not present

## 2020-11-12 DIAGNOSIS — H524 Presbyopia: Secondary | ICD-10-CM | POA: Diagnosis not present

## 2020-11-12 DIAGNOSIS — E113393 Type 2 diabetes mellitus with moderate nonproliferative diabetic retinopathy without macular edema, bilateral: Secondary | ICD-10-CM | POA: Diagnosis not present

## 2020-11-12 DIAGNOSIS — H52221 Regular astigmatism, right eye: Secondary | ICD-10-CM | POA: Diagnosis not present

## 2020-11-12 DIAGNOSIS — H25013 Cortical age-related cataract, bilateral: Secondary | ICD-10-CM | POA: Diagnosis not present

## 2020-11-12 DIAGNOSIS — H52212 Irregular astigmatism, left eye: Secondary | ICD-10-CM | POA: Diagnosis not present

## 2020-11-30 ENCOUNTER — Encounter (INDEPENDENT_AMBULATORY_CARE_PROVIDER_SITE_OTHER): Payer: HMO | Admitting: Ophthalmology

## 2020-11-30 ENCOUNTER — Other Ambulatory Visit: Payer: Self-pay

## 2020-11-30 DIAGNOSIS — H2513 Age-related nuclear cataract, bilateral: Secondary | ICD-10-CM

## 2020-11-30 DIAGNOSIS — H35033 Hypertensive retinopathy, bilateral: Secondary | ICD-10-CM | POA: Diagnosis not present

## 2020-11-30 DIAGNOSIS — H43813 Vitreous degeneration, bilateral: Secondary | ICD-10-CM

## 2020-11-30 DIAGNOSIS — I10 Essential (primary) hypertension: Secondary | ICD-10-CM

## 2020-11-30 DIAGNOSIS — E113313 Type 2 diabetes mellitus with moderate nonproliferative diabetic retinopathy with macular edema, bilateral: Secondary | ICD-10-CM | POA: Diagnosis not present

## 2020-12-11 DIAGNOSIS — H25812 Combined forms of age-related cataract, left eye: Secondary | ICD-10-CM | POA: Diagnosis not present

## 2020-12-11 DIAGNOSIS — H2512 Age-related nuclear cataract, left eye: Secondary | ICD-10-CM | POA: Diagnosis not present

## 2020-12-28 ENCOUNTER — Other Ambulatory Visit: Payer: Self-pay

## 2020-12-28 ENCOUNTER — Encounter (INDEPENDENT_AMBULATORY_CARE_PROVIDER_SITE_OTHER): Payer: HMO | Admitting: Ophthalmology

## 2020-12-28 DIAGNOSIS — H43813 Vitreous degeneration, bilateral: Secondary | ICD-10-CM

## 2020-12-28 DIAGNOSIS — H35033 Hypertensive retinopathy, bilateral: Secondary | ICD-10-CM | POA: Diagnosis not present

## 2020-12-28 DIAGNOSIS — I10 Essential (primary) hypertension: Secondary | ICD-10-CM | POA: Diagnosis not present

## 2020-12-28 DIAGNOSIS — E113313 Type 2 diabetes mellitus with moderate nonproliferative diabetic retinopathy with macular edema, bilateral: Secondary | ICD-10-CM | POA: Diagnosis not present

## 2021-01-28 ENCOUNTER — Encounter (INDEPENDENT_AMBULATORY_CARE_PROVIDER_SITE_OTHER): Payer: HMO | Admitting: Ophthalmology

## 2021-01-28 ENCOUNTER — Other Ambulatory Visit: Payer: Self-pay

## 2021-01-28 DIAGNOSIS — H35033 Hypertensive retinopathy, bilateral: Secondary | ICD-10-CM

## 2021-01-28 DIAGNOSIS — E113313 Type 2 diabetes mellitus with moderate nonproliferative diabetic retinopathy with macular edema, bilateral: Secondary | ICD-10-CM | POA: Diagnosis not present

## 2021-01-28 DIAGNOSIS — H43813 Vitreous degeneration, bilateral: Secondary | ICD-10-CM | POA: Diagnosis not present

## 2021-01-28 DIAGNOSIS — I10 Essential (primary) hypertension: Secondary | ICD-10-CM | POA: Diagnosis not present

## 2021-02-27 ENCOUNTER — Other Ambulatory Visit: Payer: Self-pay

## 2021-02-27 ENCOUNTER — Encounter (INDEPENDENT_AMBULATORY_CARE_PROVIDER_SITE_OTHER): Payer: HMO | Admitting: Ophthalmology

## 2021-02-27 DIAGNOSIS — E113313 Type 2 diabetes mellitus with moderate nonproliferative diabetic retinopathy with macular edema, bilateral: Secondary | ICD-10-CM

## 2021-02-27 DIAGNOSIS — H43813 Vitreous degeneration, bilateral: Secondary | ICD-10-CM | POA: Diagnosis not present

## 2021-02-27 DIAGNOSIS — H35033 Hypertensive retinopathy, bilateral: Secondary | ICD-10-CM | POA: Diagnosis not present

## 2021-02-27 DIAGNOSIS — I10 Essential (primary) hypertension: Secondary | ICD-10-CM | POA: Diagnosis not present

## 2021-03-11 DIAGNOSIS — E782 Mixed hyperlipidemia: Secondary | ICD-10-CM | POA: Diagnosis not present

## 2021-03-11 DIAGNOSIS — E11319 Type 2 diabetes mellitus with unspecified diabetic retinopathy without macular edema: Secondary | ICD-10-CM | POA: Diagnosis not present

## 2021-03-11 DIAGNOSIS — E789 Disorder of lipoprotein metabolism, unspecified: Secondary | ICD-10-CM | POA: Diagnosis not present

## 2021-03-11 DIAGNOSIS — E139 Other specified diabetes mellitus without complications: Secondary | ICD-10-CM | POA: Diagnosis not present

## 2021-03-27 ENCOUNTER — Other Ambulatory Visit: Payer: Self-pay

## 2021-03-27 ENCOUNTER — Encounter (INDEPENDENT_AMBULATORY_CARE_PROVIDER_SITE_OTHER): Payer: HMO | Admitting: Ophthalmology

## 2021-03-27 DIAGNOSIS — H43813 Vitreous degeneration, bilateral: Secondary | ICD-10-CM

## 2021-03-27 DIAGNOSIS — H35033 Hypertensive retinopathy, bilateral: Secondary | ICD-10-CM

## 2021-03-27 DIAGNOSIS — I10 Essential (primary) hypertension: Secondary | ICD-10-CM

## 2021-03-27 DIAGNOSIS — E113291 Type 2 diabetes mellitus with mild nonproliferative diabetic retinopathy without macular edema, right eye: Secondary | ICD-10-CM | POA: Diagnosis not present

## 2021-03-27 DIAGNOSIS — E113312 Type 2 diabetes mellitus with moderate nonproliferative diabetic retinopathy with macular edema, left eye: Secondary | ICD-10-CM

## 2021-04-24 ENCOUNTER — Encounter (INDEPENDENT_AMBULATORY_CARE_PROVIDER_SITE_OTHER): Payer: HMO | Admitting: Ophthalmology

## 2021-04-24 ENCOUNTER — Other Ambulatory Visit: Payer: Self-pay

## 2021-04-24 DIAGNOSIS — E113312 Type 2 diabetes mellitus with moderate nonproliferative diabetic retinopathy with macular edema, left eye: Secondary | ICD-10-CM | POA: Diagnosis not present

## 2021-04-24 DIAGNOSIS — H43813 Vitreous degeneration, bilateral: Secondary | ICD-10-CM | POA: Diagnosis not present

## 2021-04-24 DIAGNOSIS — E113391 Type 2 diabetes mellitus with moderate nonproliferative diabetic retinopathy without macular edema, right eye: Secondary | ICD-10-CM | POA: Diagnosis not present

## 2021-04-24 DIAGNOSIS — I10 Essential (primary) hypertension: Secondary | ICD-10-CM

## 2021-04-24 DIAGNOSIS — H35033 Hypertensive retinopathy, bilateral: Secondary | ICD-10-CM | POA: Diagnosis not present

## 2021-05-22 ENCOUNTER — Other Ambulatory Visit: Payer: Self-pay

## 2021-05-22 ENCOUNTER — Encounter (INDEPENDENT_AMBULATORY_CARE_PROVIDER_SITE_OTHER): Payer: HMO | Admitting: Ophthalmology

## 2021-05-22 DIAGNOSIS — H43813 Vitreous degeneration, bilateral: Secondary | ICD-10-CM

## 2021-05-22 DIAGNOSIS — E113312 Type 2 diabetes mellitus with moderate nonproliferative diabetic retinopathy with macular edema, left eye: Secondary | ICD-10-CM | POA: Diagnosis not present

## 2021-05-22 DIAGNOSIS — H2511 Age-related nuclear cataract, right eye: Secondary | ICD-10-CM | POA: Diagnosis not present

## 2021-05-22 DIAGNOSIS — I10 Essential (primary) hypertension: Secondary | ICD-10-CM | POA: Diagnosis not present

## 2021-05-22 DIAGNOSIS — H35033 Hypertensive retinopathy, bilateral: Secondary | ICD-10-CM | POA: Diagnosis not present

## 2021-05-22 DIAGNOSIS — E113391 Type 2 diabetes mellitus with moderate nonproliferative diabetic retinopathy without macular edema, right eye: Secondary | ICD-10-CM | POA: Diagnosis not present

## 2021-05-31 IMAGING — CT CT OF THE LEFT WRIST WITHOUT CONTRAST
2 of 7 series · 10 of 33 positions shown, 12 images · non-contrast
Comparison: None.

CLINICAL DATA: Scaphoid fracture follow-up. Assess for interval
healing.

EXAM:
CT OF THE LEFT WRIST WITHOUT CONTRAST
TECHNIQUE: Multidetector CT imaging was performed according to the standard
protocol. Multiplanar CT image reconstructions were also generated.

[Series 3: axial bone wrist · axial · 0.17mm/px · z∈[+491,+575]mm · 5 of 158 slices shown, 7 images]
[im 27/158  soft-tissue]
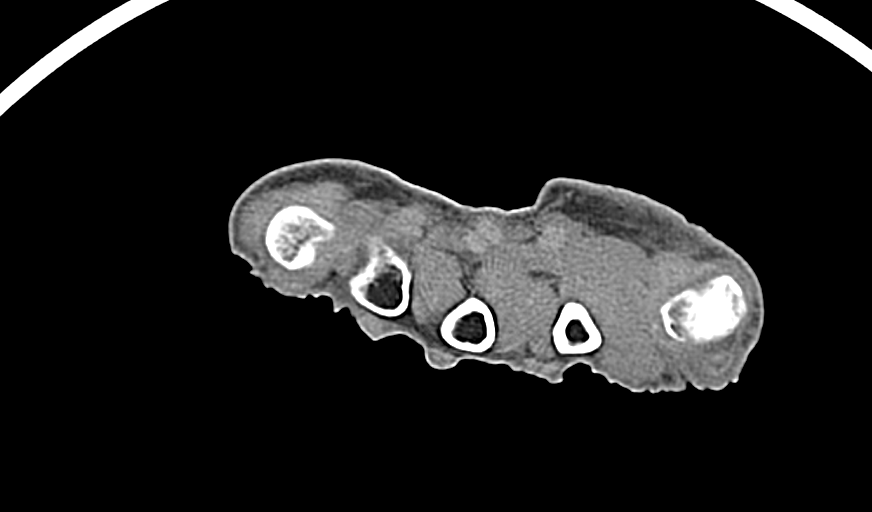
[im 27/158  bone]
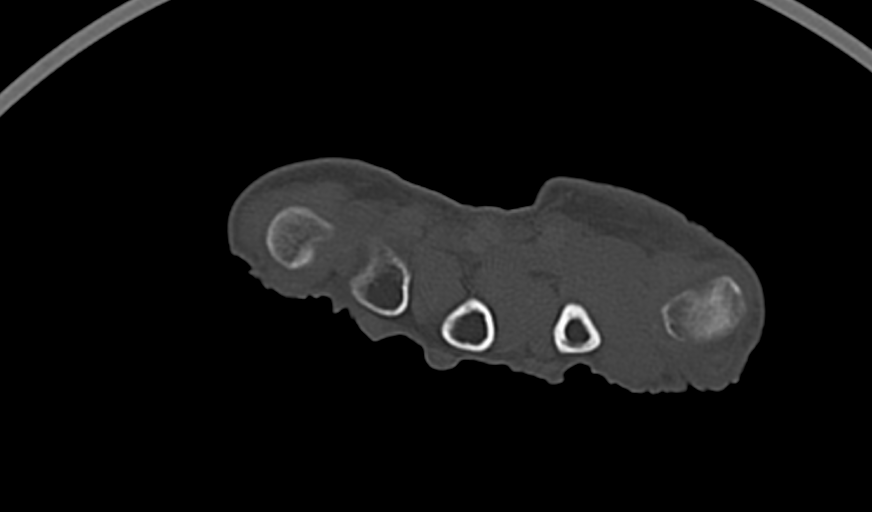
[im 53/158  bone]
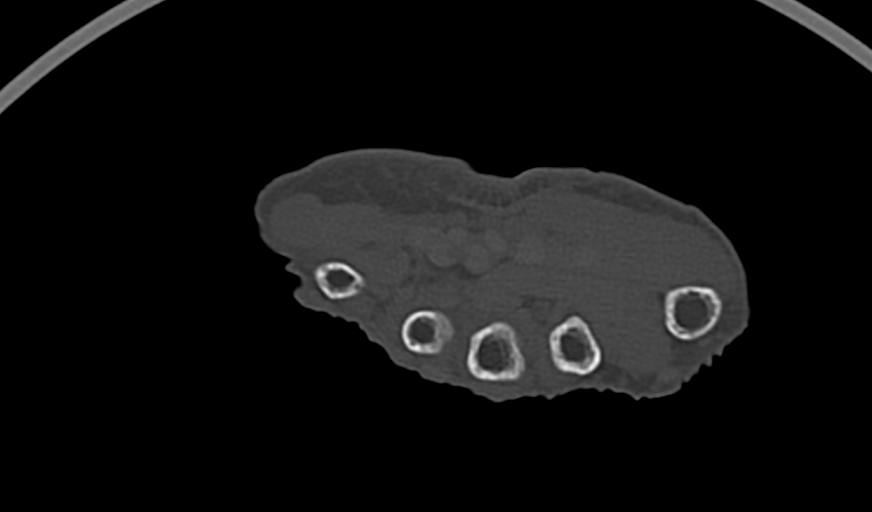
[im 79/158  bone]
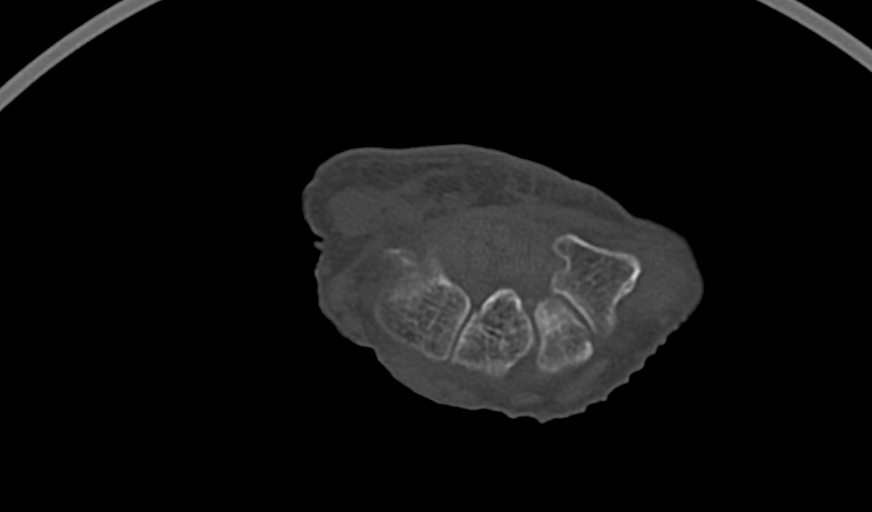
[im 105/158  bone]
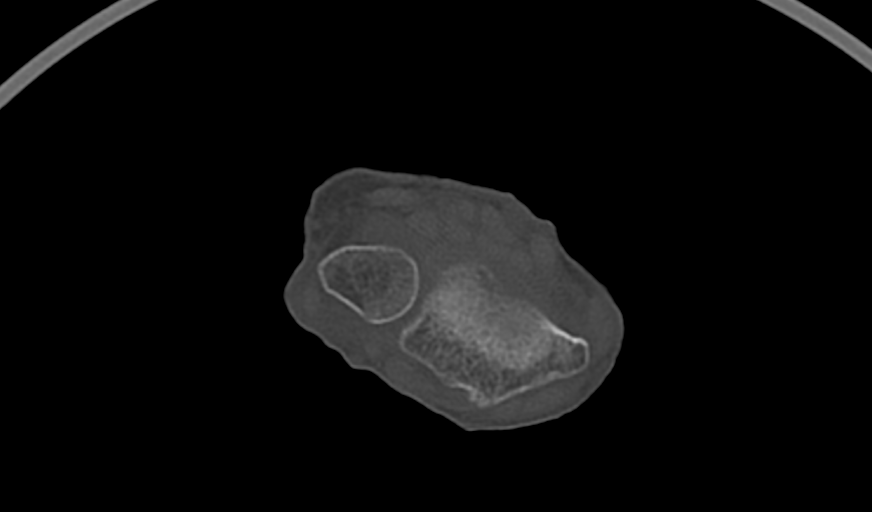
[im 131/158  soft-tissue]
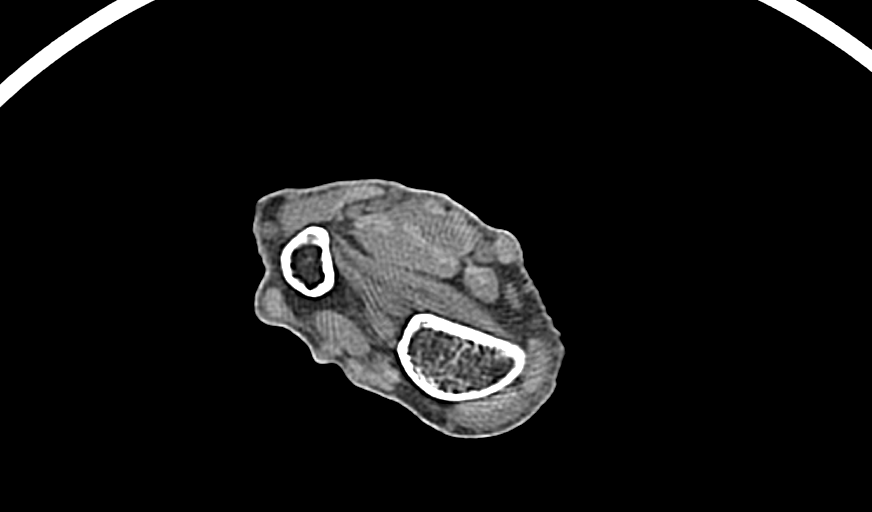
[im 131/158  bone]
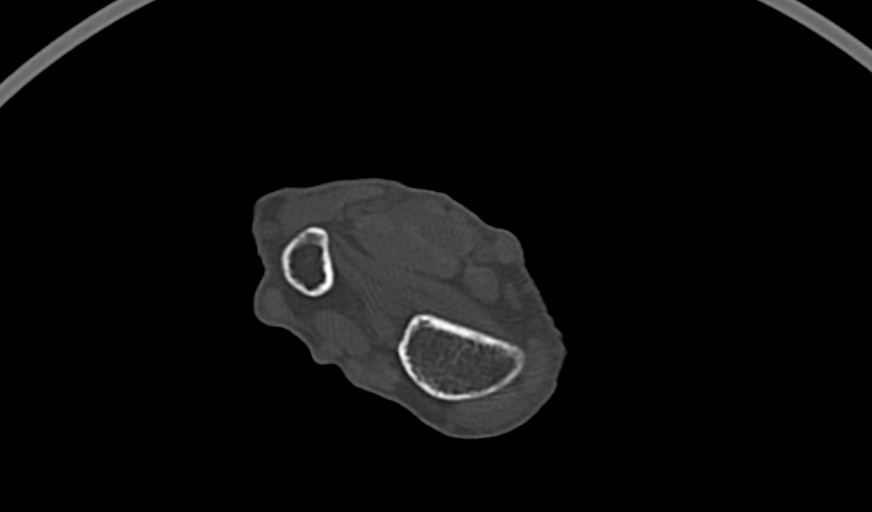

[Series 7: sagittal bone wrist · sagittal · 0.17mm/px · 5 of 186 slices shown]
[im 31/186  bone]
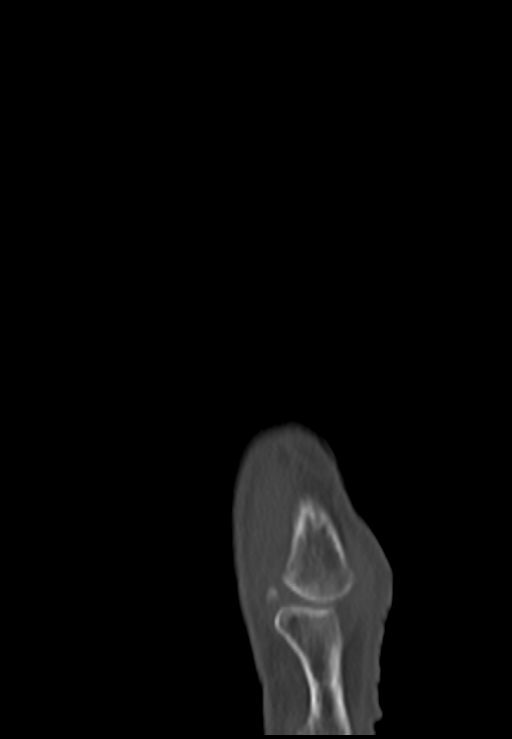
[im 62/186  bone]
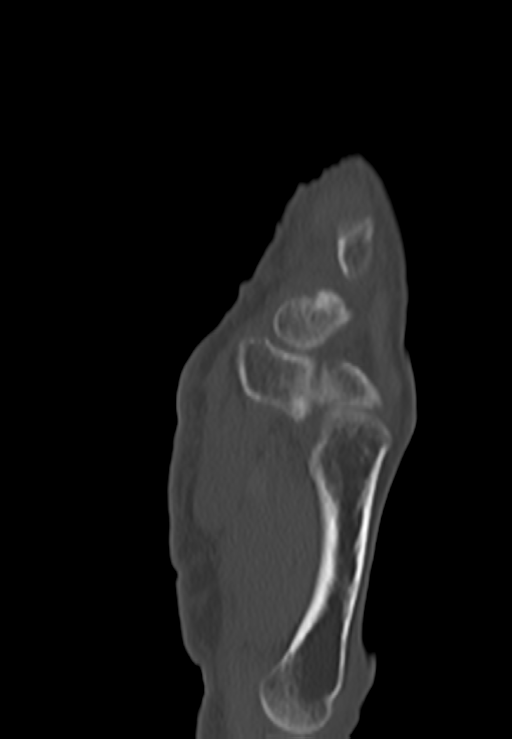
[im 93/186  bone]
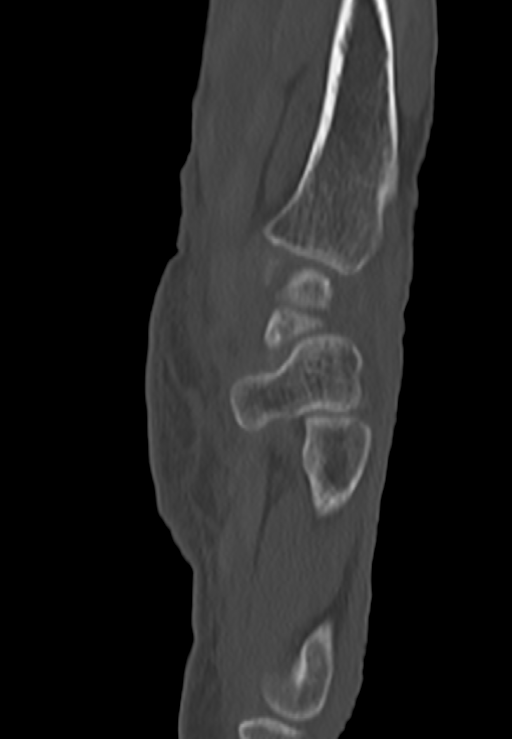
[im 124/186  bone]
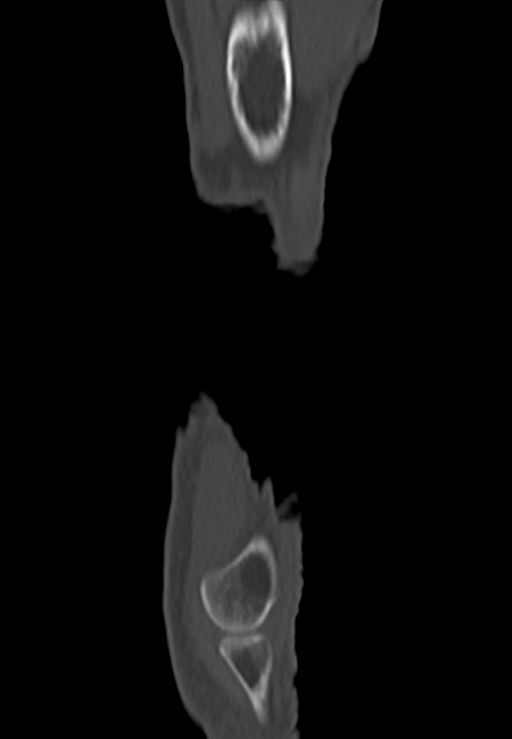
[im 155/186  bone]
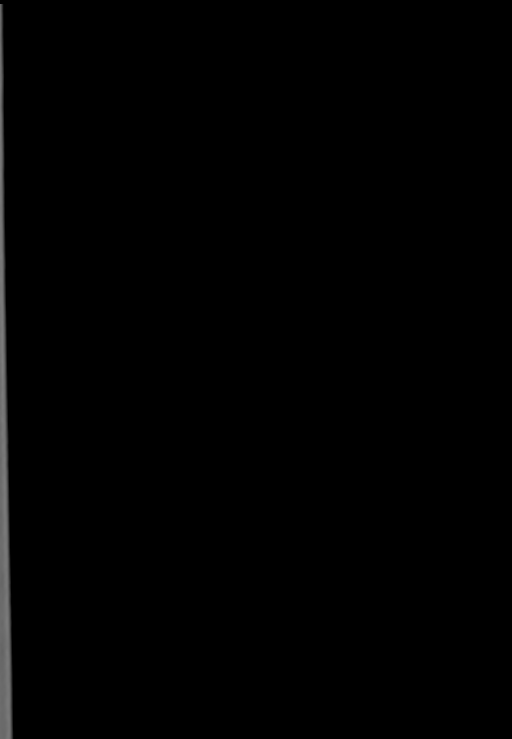

[10 of 33 positions shown; findings below may reference images not displayed]

FINDINGS: Bones/Joint/Cartilage

Nearly healed nondisplaced scaphoid waist fracture. No acute
fracture or dislocation. Joint spaces are preserved. Small cyst in
the proximal scaphoid pole. Osteopenia.

Ligaments

Suboptimally assessed by CT.

Muscles and Tendons

Grossly intact.  No muscle atrophy.

Soft tissues

No soft tissue mass or fluid collection. Chondrocalcinosis of the
scapholunate and lunotriquetral ligaments, as well as the TFCC.
IMPRESSION: 1. Nearly healed nondisplaced scaphoid waist fracture.

## 2021-06-26 ENCOUNTER — Encounter (INDEPENDENT_AMBULATORY_CARE_PROVIDER_SITE_OTHER): Payer: HMO | Admitting: Ophthalmology

## 2021-06-26 ENCOUNTER — Other Ambulatory Visit: Payer: Self-pay

## 2021-06-26 DIAGNOSIS — H43813 Vitreous degeneration, bilateral: Secondary | ICD-10-CM | POA: Diagnosis not present

## 2021-06-26 DIAGNOSIS — E113391 Type 2 diabetes mellitus with moderate nonproliferative diabetic retinopathy without macular edema, right eye: Secondary | ICD-10-CM | POA: Diagnosis not present

## 2021-06-26 DIAGNOSIS — E113312 Type 2 diabetes mellitus with moderate nonproliferative diabetic retinopathy with macular edema, left eye: Secondary | ICD-10-CM

## 2021-06-26 DIAGNOSIS — H35033 Hypertensive retinopathy, bilateral: Secondary | ICD-10-CM

## 2021-06-26 DIAGNOSIS — I10 Essential (primary) hypertension: Secondary | ICD-10-CM

## 2021-07-03 DIAGNOSIS — E139 Other specified diabetes mellitus without complications: Secondary | ICD-10-CM | POA: Diagnosis not present

## 2021-07-03 DIAGNOSIS — E11319 Type 2 diabetes mellitus with unspecified diabetic retinopathy without macular edema: Secondary | ICD-10-CM | POA: Diagnosis not present

## 2021-07-03 DIAGNOSIS — E782 Mixed hyperlipidemia: Secondary | ICD-10-CM | POA: Diagnosis not present

## 2021-07-03 DIAGNOSIS — E789 Disorder of lipoprotein metabolism, unspecified: Secondary | ICD-10-CM | POA: Diagnosis not present

## 2021-07-03 DIAGNOSIS — E118 Type 2 diabetes mellitus with unspecified complications: Secondary | ICD-10-CM | POA: Diagnosis not present

## 2021-07-15 ENCOUNTER — Encounter: Payer: Self-pay | Admitting: Dermatology

## 2021-07-15 ENCOUNTER — Ambulatory Visit: Payer: HMO | Admitting: Dermatology

## 2021-07-15 ENCOUNTER — Other Ambulatory Visit: Payer: Self-pay

## 2021-07-15 DIAGNOSIS — B009 Herpesviral infection, unspecified: Secondary | ICD-10-CM | POA: Diagnosis not present

## 2021-07-15 DIAGNOSIS — L0100 Impetigo, unspecified: Secondary | ICD-10-CM

## 2021-07-15 MED ORDER — VALACYCLOVIR HCL 500 MG PO TABS: 500.0000 mg | ORAL_TABLET | Freq: Two times a day (BID) | ORAL | 0 refills | Status: AC

## 2021-07-15 MED ORDER — DOXYCYCLINE HYCLATE 100 MG PO CAPS: 100.0000 mg | ORAL_CAPSULE | Freq: Two times a day (BID) | ORAL | 0 refills | Status: AC

## 2021-07-15 NOTE — Progress Notes (Signed)
° °  New Patient Visit  Subjective  Michaela Walsh is a 71 y.o. female who presents for the following: Skin Problem (Check spot below right nostril. Dur: several days. Painful. Has been draining. Past H/O BCC on face. Hx of fever blisters in the past on lips. ). The patient has spots, moles and lesions to be evaluated, some may be new or changing and the patient has concerns that these could be cancer.  Review of Systems: No other skin or systemic complaints except as noted in HPI or Assessment and Plan.  Objective  Well appearing patient in no apparent distress; mood and affect are within normal limits.  A focused examination was performed including face, neck, hands. Relevant physical exam findings are noted in the Assessment and Plan.  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  It is infectious and can be spread from person to person by direct contact.  It is not curable, but is treatable with topical and oral medication.  Doxycycline should be taken with food to prevent nausea. Do not lay down for 30 minutes after taking. Be cautious with sun exposure and use good sun protection while on this medication. Pregnant women should not take this medication.   valACYclovir (VALTREX) 500 MG tablet - Right Nasal Vestibule Take 1 tablet (500 mg total) by mouth 2 (two) times daily for 7 days.  doxycycline (VIBRAMYCIN) 100 MG capsule - Right Nasal Vestibule Take 1 capsule (100 mg total) by mouth 2 (two) times daily for 7 days. Take with food  Return in about 1 month (around 08/15/2021) for lip/nose  recheck.  I, Emelia Salisbury, CMA, am acting as scribe for Sarina Ser, MD.  Documentation: I have reviewed the above documentation for accuracy and completeness, and I agree with the above.  Sarina Ser, MD

## 2021-07-15 NOTE — Patient Instructions (Signed)
Doxycycline should be taken with food to prevent nausea. Do not lay down for 30 minutes after taking. Be cautious with sun exposure and use good sun protection while on this medication. Pregnant women should not take this medication.   Take Valacyclovir 500mg  twice daily for 7 days.   Take Doxycycline 100mg  twice daily for 7 days. Take with food.  If You Need Anything After Your Visit  If you have any questions or concerns for your doctor, please call our main line at [phone] and press option 4 to reach your doctor's medical assistant. If no one answers, please leave a voicemail as directed and we will return your call as soon as possible. Messages left after 4 pm will be answered the following business day.   You may also send [place] a message via [place]. We typically respond to MyChart messages within 1-2 business days.  For prescription refills, please ask your pharmacy to contact our office. Our fax number is [phone].  If you have an urgent issue when the clinic is closed that cannot wait until the next business day, you can page your doctor at the number below.    Please note that while we do our best to be available for urgent issues outside of office hours, we are not available 24/7.   If you have an urgent issue and are unable to reach [place], you may choose to seek medical care at your doctor's office, retail clinic, urgent care center, or emergency room.  If you have a medical emergency, please immediately call 911 or go to the emergency department.  Pager Numbers  - Dr. Nehemiah Massed: [phone]  - Dr. Laurence Ferrari: [phone]  - Dr. Nicole Kindred: [phone]  In the event of inclement weather, please call our main line at [phone] for an update on the status of any delays or closures.  Dermatology Medication Tips: Please keep the boxes that topical medications come in in order to help keep track of the instructions about where and how to use these. Pharmacies typically print  the medication instructions only on the boxes and not directly on the medication tubes.   If your medication is too expensive, please contact our office at [phone] option 4 or send [place] a message through [place].   We are unable to tell what your co-pay for medications will be in advance as this is different depending on your insurance coverage. However, we may be able to find a substitute medication at lower cost or fill out paperwork to get insurance to cover a needed medication.   If a prior authorization is required to get your medication covered by your insurance company, please allow [place] 1-2 business days to complete this process.  Drug prices often vary depending on where the prescription is filled and some pharmacies may offer cheaper prices.  The website www.goodrx.com contains coupons for medications through different pharmacies. The prices here do not account for what the cost may be with help from insurance (it may be cheaper with your insurance), but the website can give you the price if you did not use any insurance.  - You can print the associated coupon and take it with your prescription to the pharmacy.  - You may also stop by our office during regular business hours and pick up a GoodRx coupon card.  - If you need your prescription sent electronically to a different pharmacy, notify our office through [facility] or by phone at [phone] option 4.     Si  Usted Necesita Algo Despus de Su Visita  Tambin puede enviarnos un mensaje a travs de Pharmacist, community. Por lo general respondemos a los mensajes de MyChart en el transcurso de 1 a 2 das hbiles.  Para renovar recetas, por favor pida a su farmacia que se ponga en contacto con nuestra oficina. [place] Dingwall de fax es [place] [phone].  Si tiene un asunto urgente cuando la clnica est cerrada y que no puede esperar hasta el siguiente da hbil, puede llamar/localizar a su doctor(a) al nmero que aparece a  continuacin.   Por favor, tenga en cuenta que aunque hacemos todo lo posible para estar disponibles para asuntos urgentes fuera del horario de [place], no estamos disponibles las 24 horas del da, los 7 das de la [place].   Si tiene un problema urgente y no puede comunicarse con nosotros, puede optar por buscar atencin mdica  en el consultorio de su doctor(a), en una clnica privada, en un centro de atencin urgente o en una sala de emergencias.  Si tiene Engineering geologist, por favor llame inmediatamente al 911 o vaya a la sala de emergencias.  Nmeros de bper  - Dr. Nehemiah Massed: [phone]  - Dra. Moye: [phone]  - Dra. Nicole Kindred: [phone]  En caso de inclemencias del [place], por favor llame a Johnsie Kindred principal al [phone] para una actualizacin sobre el [place] de cualquier retraso o cierre.  Consejos para la medicacin en dermatologa: Por favor, guarde las cajas en las que vienen los medicamentos de uso tpico para ayudarle a seguir las instrucciones sobre dnde y cmo usarlos. Las farmacias generalmente imprimen las instrucciones del medicamento slo en las cajas y no directamente en los tubos del [place].   Si su medicamento es muy caro, por favor, pngase en contacto con Zigmund Daniel llamando al [phone] y presione la opcin 4 o envenos un mensaje a travs de Pharmacist, community.   No podemos decirle cul ser su copago por los medicamentos por adelantado ya que esto es diferente dependiendo de la cobertura de su seguro. Sin embargo, es posible que podamos encontrar un medicamento sustituto a Electrical engineer un formulario para que el seguro cubra el medicamento que se considera necesario.   Si se requiere una autorizacin previa para que su compaa de seguros [place] su medicamento, por favor permtanos de 1 a 2 das hbiles para completar este proceso.  Los precios de los medicamentos varan con frecuencia dependiendo del Environmental consultant de dnde se surte la receta  y alguna farmacias pueden ofrecer precios ms baratos.  El sitio web www.goodrx.com tiene cupones para medicamentos de Airline pilot. Los precios aqu no tienen en cuenta lo que podra costar con la ayuda del seguro (puede ser ms barato con su seguro), pero el sitio web puede darle el precio si no utiliz Research scientist (physical sciences).  - Puede imprimir el cupn correspondiente y llevarlo con su receta a la farmacia.  - Tambin puede pasar por nuestra oficina durante el horario de atencin regular y Charity fundraiser una tarjeta de cupones de GoodRx.  - Si necesita que su receta se enve electrnicamente a una farmacia diferente, informe a nuestra oficina a travs de MyChart de [place] o por telfono llamando al [phone] y presione la opcin 4.

## 2021-07-18 ENCOUNTER — Encounter: Payer: Self-pay | Admitting: Dermatology

## 2021-07-24 ENCOUNTER — Other Ambulatory Visit: Payer: Self-pay

## 2021-07-24 ENCOUNTER — Encounter (INDEPENDENT_AMBULATORY_CARE_PROVIDER_SITE_OTHER): Payer: HMO | Admitting: Ophthalmology

## 2021-07-24 DIAGNOSIS — H43813 Vitreous degeneration, bilateral: Secondary | ICD-10-CM | POA: Diagnosis not present

## 2021-07-24 DIAGNOSIS — E113391 Type 2 diabetes mellitus with moderate nonproliferative diabetic retinopathy without macular edema, right eye: Secondary | ICD-10-CM | POA: Diagnosis not present

## 2021-07-24 DIAGNOSIS — I10 Essential (primary) hypertension: Secondary | ICD-10-CM

## 2021-07-24 DIAGNOSIS — E113312 Type 2 diabetes mellitus with moderate nonproliferative diabetic retinopathy with macular edema, left eye: Secondary | ICD-10-CM

## 2021-07-24 DIAGNOSIS — H35033 Hypertensive retinopathy, bilateral: Secondary | ICD-10-CM | POA: Diagnosis not present

## 2021-08-19 ENCOUNTER — Ambulatory Visit: Payer: HMO | Admitting: Dermatology

## 2021-08-23 ENCOUNTER — Other Ambulatory Visit: Payer: Self-pay

## 2021-08-23 ENCOUNTER — Encounter (INDEPENDENT_AMBULATORY_CARE_PROVIDER_SITE_OTHER): Payer: HMO | Admitting: Ophthalmology

## 2021-08-23 DIAGNOSIS — H35033 Hypertensive retinopathy, bilateral: Secondary | ICD-10-CM

## 2021-08-23 DIAGNOSIS — E113391 Type 2 diabetes mellitus with moderate nonproliferative diabetic retinopathy without macular edema, right eye: Secondary | ICD-10-CM | POA: Diagnosis not present

## 2021-08-23 DIAGNOSIS — I10 Essential (primary) hypertension: Secondary | ICD-10-CM | POA: Diagnosis not present

## 2021-08-23 DIAGNOSIS — E113312 Type 2 diabetes mellitus with moderate nonproliferative diabetic retinopathy with macular edema, left eye: Secondary | ICD-10-CM | POA: Diagnosis not present

## 2021-08-23 DIAGNOSIS — H43813 Vitreous degeneration, bilateral: Secondary | ICD-10-CM

## 2021-09-20 ENCOUNTER — Encounter (INDEPENDENT_AMBULATORY_CARE_PROVIDER_SITE_OTHER): Payer: HMO | Admitting: Ophthalmology

## 2021-09-20 DIAGNOSIS — H43813 Vitreous degeneration, bilateral: Secondary | ICD-10-CM

## 2021-09-20 DIAGNOSIS — H35033 Hypertensive retinopathy, bilateral: Secondary | ICD-10-CM | POA: Diagnosis not present

## 2021-09-20 DIAGNOSIS — E113312 Type 2 diabetes mellitus with moderate nonproliferative diabetic retinopathy with macular edema, left eye: Secondary | ICD-10-CM

## 2021-09-20 DIAGNOSIS — I10 Essential (primary) hypertension: Secondary | ICD-10-CM

## 2021-09-20 DIAGNOSIS — E113391 Type 2 diabetes mellitus with moderate nonproliferative diabetic retinopathy without macular edema, right eye: Secondary | ICD-10-CM | POA: Diagnosis not present

## 2021-09-20 DIAGNOSIS — H2511 Age-related nuclear cataract, right eye: Secondary | ICD-10-CM | POA: Diagnosis not present

## 2021-09-23 DIAGNOSIS — M810 Age-related osteoporosis without current pathological fracture: Secondary | ICD-10-CM | POA: Diagnosis not present

## 2021-09-23 DIAGNOSIS — N958 Other specified menopausal and perimenopausal disorders: Secondary | ICD-10-CM | POA: Diagnosis not present

## 2021-09-23 DIAGNOSIS — Z6820 Body mass index (BMI) 20.0-20.9, adult: Secondary | ICD-10-CM | POA: Diagnosis not present

## 2021-09-23 DIAGNOSIS — Z1231 Encounter for screening mammogram for malignant neoplasm of breast: Secondary | ICD-10-CM | POA: Diagnosis not present

## 2021-09-23 DIAGNOSIS — N952 Postmenopausal atrophic vaginitis: Secondary | ICD-10-CM | POA: Diagnosis not present

## 2021-09-23 DIAGNOSIS — Z01419 Encounter for gynecological examination (general) (routine) without abnormal findings: Secondary | ICD-10-CM | POA: Diagnosis not present

## 2021-09-23 DIAGNOSIS — R29890 Loss of height: Secondary | ICD-10-CM | POA: Diagnosis not present

## 2021-09-23 DIAGNOSIS — M816 Localized osteoporosis [Lequesne]: Secondary | ICD-10-CM | POA: Diagnosis not present

## 2021-10-10 DIAGNOSIS — Z79899 Other long term (current) drug therapy: Secondary | ICD-10-CM | POA: Diagnosis not present

## 2021-10-10 DIAGNOSIS — E139 Other specified diabetes mellitus without complications: Secondary | ICD-10-CM | POA: Diagnosis not present

## 2021-10-17 DIAGNOSIS — E782 Mixed hyperlipidemia: Secondary | ICD-10-CM | POA: Diagnosis not present

## 2021-10-17 DIAGNOSIS — E139 Other specified diabetes mellitus without complications: Secondary | ICD-10-CM | POA: Diagnosis not present

## 2021-10-17 DIAGNOSIS — M810 Age-related osteoporosis without current pathological fracture: Secondary | ICD-10-CM | POA: Diagnosis not present

## 2021-10-17 DIAGNOSIS — R0989 Other specified symptoms and signs involving the circulatory and respiratory systems: Secondary | ICD-10-CM | POA: Diagnosis not present

## 2021-10-17 DIAGNOSIS — M419 Scoliosis, unspecified: Secondary | ICD-10-CM | POA: Diagnosis not present

## 2021-10-17 DIAGNOSIS — E11319 Type 2 diabetes mellitus with unspecified diabetic retinopathy without macular edema: Secondary | ICD-10-CM | POA: Diagnosis not present

## 2021-10-17 DIAGNOSIS — I10 Essential (primary) hypertension: Secondary | ICD-10-CM | POA: Diagnosis not present

## 2021-10-17 DIAGNOSIS — R0981 Nasal congestion: Secondary | ICD-10-CM | POA: Diagnosis not present

## 2021-10-17 DIAGNOSIS — E118 Type 2 diabetes mellitus with unspecified complications: Secondary | ICD-10-CM | POA: Diagnosis not present

## 2021-10-17 DIAGNOSIS — Z0000 Encounter for general adult medical examination without abnormal findings: Secondary | ICD-10-CM | POA: Diagnosis not present

## 2021-10-18 ENCOUNTER — Encounter (INDEPENDENT_AMBULATORY_CARE_PROVIDER_SITE_OTHER): Payer: HMO | Admitting: Ophthalmology

## 2021-10-18 DIAGNOSIS — E113391 Type 2 diabetes mellitus with moderate nonproliferative diabetic retinopathy without macular edema, right eye: Secondary | ICD-10-CM

## 2021-10-18 DIAGNOSIS — I10 Essential (primary) hypertension: Secondary | ICD-10-CM | POA: Diagnosis not present

## 2021-10-18 DIAGNOSIS — H35033 Hypertensive retinopathy, bilateral: Secondary | ICD-10-CM

## 2021-10-18 DIAGNOSIS — E113312 Type 2 diabetes mellitus with moderate nonproliferative diabetic retinopathy with macular edema, left eye: Secondary | ICD-10-CM | POA: Diagnosis not present

## 2021-10-18 DIAGNOSIS — H43813 Vitreous degeneration, bilateral: Secondary | ICD-10-CM | POA: Diagnosis not present

## 2021-10-21 DIAGNOSIS — E789 Disorder of lipoprotein metabolism, unspecified: Secondary | ICD-10-CM | POA: Diagnosis not present

## 2021-10-21 DIAGNOSIS — E782 Mixed hyperlipidemia: Secondary | ICD-10-CM | POA: Diagnosis not present

## 2021-10-21 DIAGNOSIS — E139 Other specified diabetes mellitus without complications: Secondary | ICD-10-CM | POA: Diagnosis not present

## 2021-10-21 DIAGNOSIS — E11319 Type 2 diabetes mellitus with unspecified diabetic retinopathy without macular edema: Secondary | ICD-10-CM | POA: Diagnosis not present

## 2021-11-22 ENCOUNTER — Encounter (INDEPENDENT_AMBULATORY_CARE_PROVIDER_SITE_OTHER): Payer: HMO | Admitting: Ophthalmology

## 2021-11-22 DIAGNOSIS — H43813 Vitreous degeneration, bilateral: Secondary | ICD-10-CM | POA: Diagnosis not present

## 2021-11-22 DIAGNOSIS — H35033 Hypertensive retinopathy, bilateral: Secondary | ICD-10-CM

## 2021-11-22 DIAGNOSIS — I10 Essential (primary) hypertension: Secondary | ICD-10-CM

## 2021-11-22 DIAGNOSIS — E113312 Type 2 diabetes mellitus with moderate nonproliferative diabetic retinopathy with macular edema, left eye: Secondary | ICD-10-CM

## 2021-11-22 DIAGNOSIS — E113391 Type 2 diabetes mellitus with moderate nonproliferative diabetic retinopathy without macular edema, right eye: Secondary | ICD-10-CM

## 2021-11-27 ENCOUNTER — Encounter: Payer: Self-pay | Admitting: Dermatology

## 2021-11-27 ENCOUNTER — Ambulatory Visit: Payer: HMO | Admitting: Dermatology

## 2021-11-27 DIAGNOSIS — L570 Actinic keratosis: Secondary | ICD-10-CM | POA: Diagnosis not present

## 2021-11-27 NOTE — Progress Notes (Signed)
Follow-Up Visit   Subjective  Michaela Walsh is a 71 y.o. female who presents for the following: Annual Exam (Tbse, hx of scc at face many years ago removed by Dr. Clydie Braun in [place] , patient reports some spots at face, scalp, forehead, right cheek and chest she would like checked. ).   The patient presents for Total-Body Skin Exam (TBSE) for skin cancer screening and mole check.  The patient has spots, moles and lesions to be evaluated, some may be new or changing and the patient has concerns that these could be cancer.   The following portions of the chart were reviewed this encounter and updated as appropriate:      Review of Systems: No other skin or systemic complaints except as noted in HPI or Assessment and Plan.   Objective  Well appearing patient in no apparent distress; mood and affect are within normal limits.  A full examination was performed including scalp, head, eyes, ears, nose, lips, neck, chest, axillae, abdomen, back, buttocks, bilateral upper extremities, bilateral lower extremities, hands, feet, fingers, toes, fingernails, and toenails. All findings within normal limits unless otherwise noted below.  left forehead x 1, left cheek x 3,  left temple x 1, left forearm x 2, nose x 8,  Right cheek x 2, right temple x 1,  right shoulder x 1, right upper arm x 1, left sternum x 1, frontal scalp at hairline x 3 (24) Keratotic macules and papules    Assessment & Plan  Actinic keratosis (24) left forehead x 1, left cheek x 3,  left temple x 1, left forearm x 2, nose x 8,  Right cheek x 2, right temple x 1,  right shoulder x 1, right upper arm x 1, left sternum x 1, frontal scalp at hairline x 3  Hypertrophic AKs vs ISKs   Will plan to recheck right cheek, if not resolved will consider bx.   Actinic keratoses are precancerous spots that appear secondary to cumulative UV radiation exposure/sun exposure over time. They are chronic with expected duration over 1  year. A portion of actinic keratoses will progress to squamous cell carcinoma of the skin. It is not possible to reliably predict which spots will progress to skin cancer and so treatment is recommended to prevent development of skin cancer.  Recommend daily broad spectrum sunscreen SPF 30+ to sun-exposed areas, reapply every 2 hours as needed.  Recommend staying in the shade or wearing long sleeves, sun glasses (UVA+UVB protection) and wide brim hats (4-inch brim around the entire circumference of the hat). Call for new or changing lesions.  Destruction of lesion - left forehead x 1, left cheek x 3,  left temple x 1, left forearm x 2, nose x 8,  Right cheek x 2, right temple x 1,  right shoulder x 1, right upper arm x 1, left sternum x 1, frontal scalp at hairline x 3  Destruction method: cryotherapy   Informed consent: discussed and consent obtained   Lesion destroyed using liquid nitrogen: Yes   Region frozen until ice ball extended beyond lesion: Yes   Outcome: patient tolerated procedure well with no complications   Post-procedure details: wound care instructions given   Additional details:  Prior to procedure, discussed risks of blister formation, small wound, skin dyspigmentation, or rare scar following cryotherapy. Recommend Vaseline ointment to treated areas while healing.   Lentigines - Scattered tan macules - Due to sun exposure - Benign-appearing, observe - Recommend daily broad  spectrum sunscreen SPF 30+ to sun-exposed areas, reapply every 2 hours as needed. - Call for any changes  Seborrheic Keratoses - Stuck-on, waxy, tan-brown papules and/or plaques on left upper arm  - Benign-appearing - Discussed benign etiology and prognosis. - Observe - Call for any changes  Melanocytic Nevi - Tan-brown and/or pink-flesh-colored symmetric macules and papules - Benign appearing on exam today - Observation - Call clinic for new or changing moles - Recommend daily use of broad  spectrum spf 30+ sunscreen to sun-exposed areas.   Hemangiomas - Red papules - Discussed benign nature - Observe - Call for any changes  Actinic Damage - Chronic condition, secondary to cumulative UV/sun exposure - diffuse scaly erythematous macules with underlying dyspigmentation - Recommend daily broad spectrum sunscreen SPF 30+ to sun-exposed areas, reapply every 2 hours as needed.  - Staying in the shade or wearing long sleeves, sun glasses (UVA+UVB protection) and wide brim hats (4-inch brim around the entire circumference of the hat) are also recommended for sun protection.  - Call for new or changing lesions.  History of Squamous Cell Carcinoma of the Skin at face , done by Dr Clydie Braun in [place] many years ago - No evidence of recurrence today - Recommend regular full body skin exams - Recommend daily broad spectrum sunscreen SPF 30+ to sun-exposed areas, reapply every 2 hours as needed.  - Call if any new or changing lesions are noted between office visits  Skin cancer screening performed today. Return in about 6 weeks (around 01/08/2022) for ak followup, 1 year tbse . I, Ruthell Rummage, CMA, am acting as scribe for Brendolyn Patty, MD.  Documentation: I have reviewed the above documentation for accuracy and completeness, and I agree with the above.  Brendolyn Patty MD

## 2021-11-27 NOTE — Patient Instructions (Addendum)
Actinic keratoses are precancerous spots that appear secondary to cumulative UV radiation exposure/sun exposure over time. They are chronic with expected duration over 1 year. A portion of actinic keratoses will progress to squamous cell carcinoma of the skin. It is not possible to reliably predict which spots will progress to skin cancer and so treatment is recommended to prevent development of skin cancer.  Recommend daily broad spectrum sunscreen SPF 30+ to sun-exposed areas, reapply every 2 hours as needed.  Recommend staying in the shade or wearing long sleeves, sun glasses (UVA+UVB protection) and wide brim hats (4-inch brim around the entire circumference of the hat). Call for new or changing lesions.   Cryotherapy Aftercare  Wash gently with soap and water everyday.   Apply Vaseline and Band-Aid daily until healed.    Recommend starting moisturizer with exfoliant (Urea, Salicylic acid, or Lactic acid) one to two times daily to help smooth rough and bumpy skin.  OTC options include Cetaphil Rough and Bumpy lotion (Urea), Eucerin Roughness Relief lotion or spot treatment cream (Urea), CeraVe SA lotion/cream for Rough and Bumpy skin (Sal Acid), Gold Bond Rough and Bumpy cream (Sal Acid), and AmLactin 12% lotion/cream (Lactic Acid).  If applying in morning, also apply sunscreen to sun-exposed areas, since these exfoliating moisturizers can increase sensitivity to sun.   Seborrheic Keratosis  What causes seborrheic keratoses? Seborrheic keratoses are harmless, common skin growths that first appear during adult life.  As time goes by, more growths appear.  Some people may develop a large number of them.  Seborrheic keratoses appear on both covered and uncovered body parts.  They are not caused by sunlight.  The tendency to develop seborrheic keratoses can be inherited.  They vary in color from skin-colored to gray, brown, or even black.  They can be either smooth or have a rough, warty surface.    Seborrheic keratoses are superficial and look as if they were stuck on the skin.  Under the microscope this type of keratosis looks like layers upon layers of skin.  That is why at times the top layer may seem to fall off, but the rest of the growth remains and re-grows.    Treatment Seborrheic keratoses do not need to be treated, but can easily be removed in the office.  Seborrheic keratoses often cause symptoms when they rub on clothing or jewelry.  Lesions can be in the way of shaving.  If they become inflamed, they can cause itching, soreness, or burning.  Removal of a seborrheic keratosis can be accomplished by freezing, burning, or surgery. If any spot bleeds, scabs, or grows rapidly, please return to have it checked, as these can be an indication of a skin cancer.   Melanoma ABCDEs  Melanoma is the most dangerous type of skin cancer, and is the leading cause of death from skin disease.  You are more likely to develop melanoma if you: Have light-colored skin, light-colored eyes, or red or blond hair Spend a lot of time in the sun Tan regularly, either outdoors or in a tanning bed Have had blistering sunburns, especially during childhood Have a close family member who has had a melanoma Have atypical moles or large birthmarks  Early detection of melanoma is key since treatment is typically straightforward and cure rates are extremely high if we catch it early.   The first sign of melanoma is often a change in a mole or a new dark spot.  The ABCDE system is a way of remembering the  signs of melanoma.  A for asymmetry:  The two halves do not match. B for border:  The edges of the growth are irregular. C for color:  A mixture of colors are present instead of an even brown color. D for diameter:  Melanomas are usually (but not always) greater than 56m - the size of a pencil eraser. E for evolution:  The spot keeps changing in size, shape, and color.  Please check your skin once per  month between visits. You can use a small mirror in front and a large mirror behind you to keep an eye on the back side or your body.   If you see any new or changing lesions before your next follow-up, please call to schedule a visit.  Please continue daily skin protection including broad spectrum sunscreen SPF 30+ to sun-exposed areas, reapplying every 2 hours as needed when you're outdoors.   Staying in the shade or wearing long sleeves, sun glasses (UVA+UVB protection) and wide brim hats (4-inch brim around the entire circumference of the hat) are also recommended for sun protection.    Due to recent changes in healthcare laws, you may see results of your pathology and/or laboratory studies on MyChart before the doctors have had a chance to review them. We understand that in some cases there may be results that are confusing or concerning to you. Please understand that not all results are received at the same time and often the doctors may need to interpret multiple results in order to provide you with the best plan of care or course of treatment. Therefore, we ask that you please give u[place]2 business days to thoroughly review all your results before contacting the office for clarification. Should we see a critical lab result, you will be contacted sooner.   If You Need Anything After Your Visit  If you have any questions or concerns for your doctor, please call our main line at [phone] press option 4 to reach your doctor's medical assistant. If no one answers, please leave a voicemail as directed and we will return your call as soon as possible. Messages left after 4 pm will be answered the following business day.   You may also send [place] message via [facility] [facility] We typically respond to MyChart messages within 1-2 business days.  For prescription refills, please ask your pharmacy to contact our office. Our fax number is [phone]  If you have an urgent issue when the clinic is closed  that cannot wait until the next business day, you can page your doctor at the number below.    Please note that while we do our best to be available for urgent issues outside of office hours, we are not available 24/7.   If you have an urgent issue and are unable to reach u[place] you may choose to seek medical care at your doctor's office, retail clinic, urgent care center, or emergency room.  If you have a medical emergency, please immediately call 911 or go to the emergency department.  Pager Numbers  - Dr. KNehemiah Massed [phone] - Dr. MLaurence Ferrari [phone] - Dr. SNicole Kindred [phone] In the event of inclement weather, please call our main line at [phone] an update on the status of any delays or closures.  Dermatology Medication Tips: Please keep the boxes that topical medications come in in order to help keep track of the instructions about where and how to use these. Pharmacies typically print the medication instructions only  on the boxes and not directly on the medication tubes.   If your medication is too expensive, please contact our office at [phone] option 4 or send [place] a message through [place].   We are unable to tell what your co-pay for medications will be in advance as this is different depending on your insurance coverage. However, we may be able to find a substitute medication at lower cost or fill out paperwork to get insurance to cover a needed medication.   If a prior authorization is required to get your medication covered by your insurance company, please allow [place] 1-2 business days to complete this process.  Drug prices often vary depending on where the prescription is filled and some pharmacies may offer cheaper prices.  The website www.goodrx.com contains coupons for medications through different pharmacies. The prices here do not account for what the cost may be with help from insurance (it may be cheaper with your insurance), but the website can give you  the price if you did not use any insurance.  - You can print the associated coupon and take it with your prescription to the pharmacy.  - You may also stop by our office during regular business hours and pick up a GoodRx coupon card.  - If you need your prescription sent electronically to a different pharmacy, notify our office through [facility] or by phone at [phone] option 4.     Si Usted Necesita Algo Despus de Su Visita  Tambin puede enviarnos un mensaje a travs de Pharmacist, community. Por lo general respondemos a los mensajes de MyChart en el transcurso de 1 a 2 das hbiles.  Para renovar recetas, por favor pida a su farmacia que se ponga en contacto con nuestra oficina. Harland Dingwall de fax es [place] [phone].  Si tiene un asunto urgente cuando la clnica est cerrada y que no puede esperar hasta el siguiente da hbil, puede llamar/localizar a su doctor(a) al nmero que aparece a continuacin.   Por favor, tenga en cuenta que aunque hacemos todo lo posible para estar disponibles para asuntos urgentes fuera del horario de [place], no estamos disponibles las 24 horas del da, los 7 das de la [place].   Si tiene un problema urgente y no puede comunicarse con nosotros, puede optar por buscar atencin mdica  en el consultorio de su doctor(a), en una clnica privada, en un centro de atencin urgente o en una sala de emergencias.  Si tiene Engineering geologist, por favor llame inmediatamente al 911 o vaya a la sala de emergencias.  Nmeros de bper  - Dr. Nehemiah Massed: [phone]  - Dra. Moye: [phone]  - Dra. Nicole Kindred: [phone]  En caso de inclemencias del [place], por favor llame a Johnsie Kindred principal al [phone] para una actualizacin sobre el [place] de cualquier retraso o cierre.  Consejos para la medicacin en dermatologa: Por favor, guarde las cajas en las que vienen los medicamentos de uso tpico para ayudarle a seguir las instrucciones sobre dnde y  cmo usarlos. Las farmacias generalmente imprimen las instrucciones del medicamento slo en las cajas y no directamente en los tubos del [place].   Si su medicamento es muy caro, por favor, pngase en contacto con Zigmund Daniel llamando al [phone] y presione la opcin 4 o envenos un mensaje a travs de Pharmacist, community.   No podemos decirle cul ser su copago por los medicamentos por adelantado ya que esto es diferente dependiendo de la cobertura de su seguro. Sin embargo, es posible  o llenar un formulario para que el seguro cubra el medicamento que se considera necesario.   Si se requiere una autorizacin previa para que su compaa de seguros cubra su medicamento, por favor permtanos de 1 a 2 das hbiles para completar este proceso.  Los precios de los medicamentos varan con frecuencia dependiendo del lugar de dnde se surte la receta y alguna farmacias pueden ofrecer precios ms baratos.  El sitio web www.goodrx.com tiene cupones para medicamentos de diferentes farmacias. Los precios aqu no tienen en cuenta lo que podra costar con la ayuda del seguro (puede ser ms barato con su seguro), pero el sitio web puede darle el precio si no utiliz ningn seguro.  - Puede imprimir el cupn correspondiente y llevarlo con su receta a la farmacia.  - Tambin puede pasar por nuestra oficina durante el horario de atencin regular y recoger una tarjeta de cupones de GoodRx.  - Si necesita que su receta se enve electrnicamente a una farmacia diferente, informe a nuestra oficina a travs de MyChart de [place] o por telfono llamando al [phone] y presione la opcin 4.  

## 2021-12-20 ENCOUNTER — Encounter (INDEPENDENT_AMBULATORY_CARE_PROVIDER_SITE_OTHER): Payer: HMO | Admitting: Ophthalmology

## 2021-12-20 DIAGNOSIS — H35033 Hypertensive retinopathy, bilateral: Secondary | ICD-10-CM | POA: Diagnosis not present

## 2021-12-20 DIAGNOSIS — H43813 Vitreous degeneration, bilateral: Secondary | ICD-10-CM | POA: Diagnosis not present

## 2021-12-20 DIAGNOSIS — I10 Essential (primary) hypertension: Secondary | ICD-10-CM

## 2021-12-20 DIAGNOSIS — E113313 Type 2 diabetes mellitus with moderate nonproliferative diabetic retinopathy with macular edema, bilateral: Secondary | ICD-10-CM | POA: Diagnosis not present

## 2022-01-14 ENCOUNTER — Encounter: Payer: Self-pay | Admitting: Dermatology

## 2022-01-14 ENCOUNTER — Ambulatory Visit: Payer: HMO | Admitting: Dermatology

## 2022-01-14 DIAGNOSIS — L739 Follicular disorder, unspecified: Secondary | ICD-10-CM | POA: Diagnosis not present

## 2022-01-14 DIAGNOSIS — L578 Other skin changes due to chronic exposure to nonionizing radiation: Secondary | ICD-10-CM

## 2022-01-14 DIAGNOSIS — D485 Neoplasm of uncertain behavior of skin: Secondary | ICD-10-CM

## 2022-01-14 DIAGNOSIS — L570 Actinic keratosis: Secondary | ICD-10-CM | POA: Diagnosis not present

## 2022-01-14 DIAGNOSIS — L821 Other seborrheic keratosis: Secondary | ICD-10-CM | POA: Diagnosis not present

## 2022-01-14 DIAGNOSIS — C4490 Unspecified malignant neoplasm of skin, unspecified: Secondary | ICD-10-CM

## 2022-01-14 DIAGNOSIS — D492 Neoplasm of unspecified behavior of bone, soft tissue, and skin: Secondary | ICD-10-CM

## 2022-01-14 DIAGNOSIS — L989 Disorder of the skin and subcutaneous tissue, unspecified: Secondary | ICD-10-CM | POA: Diagnosis not present

## 2022-01-14 HISTORY — DX: Unspecified malignant neoplasm of skin, unspecified: C44.90

## 2022-01-14 MED ORDER — DOXYCYCLINE HYCLATE 100 MG PO CAPS: 100.0000 mg | ORAL_CAPSULE | Freq: Two times a day (BID) | ORAL | 0 refills | Status: AC

## 2022-01-14 MED ORDER — MUPIROCIN 2 % EX OINT: TOPICAL_OINTMENT | CUTANEOUS | 1 refills | Status: AC

## 2022-01-14 NOTE — Progress Notes (Signed)
Follow-Up Visit   Subjective  Michaela Walsh is a 71 y.o. female who presents for the following: Actinic Keratosis (6 week follow up. Face, left forearm, right shoulder, right upper arm, left sternum, frontal scalp. Tx with LN2 at last visit. Most areas have resolved. Has new areas of concern today. C/O area on right great toe. Dur: years. Raises up at times/after trauma).  Also has sore spot at hairline.  The patient has spots, moles and lesions to be evaluated, some may be new or changing and the patient has concerns that these could be cancer.   The following portions of the chart were reviewed this encounter and updated as appropriate:      Review of Systems: No other skin or systemic complaints except as noted in HPI or Assessment and Plan.   Objective  Well appearing patient in no apparent distress; mood and affect are within normal limits.  A focused examination was performed including face, scalp, arms, chest. Relevant physical exam findings are noted in the Assessment and Plan.  left nasal tip (residual) x2, right forehead x1, right sternal notch x1 (4) Erythematous thin papules/macules with gritty scale.   right frontal hair line Pink edematous tender subcutaneous nodule, ~1 cm  Right great plantar toe 7 mm firm flesh nodule      Assessment & Plan   Actinic Damage - chronic, secondary to cumulative UV radiation exposure/sun exposure over time - diffuse scaly erythematous macules with underlying dyspigmentation - Recommend daily broad spectrum sunscreen SPF 30+ to sun-exposed areas, reapply every 2 hours as needed.  - Recommend staying in the shade or wearing long sleeves, sun glasses (UVA+UVB protection) and wide brim hats (4-inch brim around the entire circumference of the hat). - Call for new or changing lesions.    Seborrheic Keratoses - Stuck-on, waxy, tan-brown papules and/or plaques  - Benign-appearing - Discussed benign etiology and prognosis. -  Observe - Call for any changes   AK (actinic keratosis) (4) left nasal tip (residual) x2, right forehead x1, right sternal notch x1  Actinic keratoses are precancerous spots that appear secondary to cumulative UV radiation exposure/sun exposure over time. They are chronic with expected duration over 1 year. A portion of actinic keratoses will progress to squamous cell carcinoma of the skin. It is not possible to reliably predict which spots will progress to skin cancer and so treatment is recommended to prevent development of skin cancer.  Recommend daily broad spectrum sunscreen SPF 30+ to sun-exposed areas, reapply every 2 hours as needed.  Recommend staying in the shade or wearing long sleeves, sun glasses (UVA+UVB protection) and wide brim hats (4-inch brim around the entire circumference of the hat). Call for new or changing lesions.  Destruction of lesion - left nasal tip (residual) x2, right forehead x1, right sternal notch x1  Destruction method: cryotherapy   Informed consent: discussed and consent obtained   Lesion destroyed using liquid nitrogen: Yes   Region frozen until ice ball extended beyond lesion: Yes   Outcome: patient tolerated procedure well with no complications   Post-procedure details: wound care instructions given   Additional details:  Prior to procedure, discussed risks of blister formation, small wound, skin dyspigmentation, or rare scar following cryotherapy. Recommend Vaseline ointment to treated areas while healing.   Folliculitis right frontal hair line  Start Doxycycline 100 mg 1 capsule twice daily with food for 7 days  Start Mupirocin ointment twice daily to affected areas.  Can try warm compresses to  area to see it will come to a head and drain.   Doxycycline should be taken with food to prevent nausea. Do not lay down for 30 minutes after taking. Be cautious with sun exposure and use good sun protection while on this medication. Pregnant women  should not take this medication.    doxycycline (VIBRAMYCIN) 100 MG capsule - right frontal hair line Take 1 capsule (100 mg total) by mouth 2 (two) times daily for 7 days. Take with food  mupirocin ointment (BACTROBAN) 2 % - right frontal hair line Apply twice daily to affected area on scalp and toe  Neoplasm of skin Right great plantar toe  Epidermal / dermal shaving  Lesion diameter (cm):  0.7 Informed consent: discussed and consent obtained   Patient was prepped and draped in usual sterile fashion: Area prepped with alcohol. Anesthesia: the lesion was anesthetized in a standard fashion   Anesthetic:  1% lidocaine w/ epinephrine 1-100,000 buffered w/ 8.4% NaHCO3 Instrument used: flexible razor blade   Hemostasis achieved with: pressure, aluminum chloride and electrodesiccation   Outcome: patient tolerated procedure well   Post-procedure details: wound care instructions given   Post-procedure details comment:  Ointment and small bandage applied  Specimen 1 - Surgical pathology Differential Diagnosis: digital fibroma/keratoma  Check Margins: No   Return in about 6 months (around 07/17/2022) for AK Follow Up.  I, Lawson Radar, CMA, am acting as scribe for Willeen Niece, MD.  Documentation: I have reviewed the above documentation for accuracy and completeness, and I agree with the above.  Willeen Niece MD

## 2022-01-14 NOTE — Patient Instructions (Addendum)
Cryotherapy Aftercare  Wash gently with soap and water everyday.   Apply Vaseline and Band-Aid daily until healed.    Start Doxycycline 100 mg 1 capsule twice daily with food for 7 days  Start Mupirocin ointment twice daily to affected areas.  Can try warm compresses to area.   Doxycycline should be taken with food to prevent nausea. Do not lay down for 30 minutes after taking. Be cautious with sun exposure and use good sun protection while on this medication. Pregnant women should not take this medication.   Wound Care Instructions  Cleanse wound gently with soap and water once a day then pat dry with clean gauze. Apply a thing coat of Petrolatum (petroleum jelly, "Vaseline") over the wound (unless you have an allergy to this). We recommend that you use a new, sterile tube of Vaseline. Do not pick or remove scabs. Do not remove the yellow or white "healing tissue" from the base of the wound.  Cover the wound with fresh, clean, nonstick gauze and secure with paper tape. You may use Band-Aids in place of gauze and tape if the would is small enough, but would recommend trimming much of the tape off as there is often too much. Sometimes Band-Aids can irritate the skin.  You should call the office for your biopsy report after 1 week if you have not already been contacted.  If you experience any problems, such as abnormal amounts of bleeding, swelling, significant bruising, significant pain, or evidence of infection, please call the office immediately.  FOR ADULT SURGERY PATIENTS: If you need something for pain relief you may take 1 extra strength Tylenol (acetaminophen) AND 2 Ibuprofen ('200mg'$  each) together every 4 hours as needed for pain. (do not take these if you are allergic to them or if you have a reason you should not take them.) Typically, you may only need pain medication for 1 to 3 days.      Due to recent changes in healthcare laws, you may see results of your pathology and/or  laboratory studies on MyChart before the doctors have had a chance to review them. We understand that in some cases there may be results that are confusing or concerning to you. Please understand that not all results are received at the same time and often the doctors may need to interpret multiple results in order to provide you with the best plan of care or course of treatment. Therefore, we ask that you please give [place] 2 business days to thoroughly review all your results before contacting the office for clarification. Should we see a critical lab result, you will be contacted sooner.   If You Need Anything After Your Visit  If you have any questions or concerns for your doctor, please call our main line at [phone] and press option 4 to reach your doctor's medical assistant. If no one answers, please leave a voicemail as directed and we will return your call as soon as possible. Messages left after 4 pm will be answered the following business day.   You may also send [place] a message via [place]. We typically respond to MyChart messages within 1-2 business days.  For prescription refills, please ask your pharmacy to contact our office. Our fax number is [phone].  If you have an urgent issue when the clinic is closed that cannot wait until the next business day, you can page your doctor at the number below.    Please note that while we do our best to  be available for urgent issues outside of office hours, we are not available 24/7.   If you have an urgent issue and are unable to reach [place], you may choose to seek medical care at your doctor's office, retail clinic, urgent care center, or emergency room.  If you have a medical emergency, please immediately call 911 or go to the emergency department.  Pager Numbers  - Dr. Nehemiah Massed: [phone]  - Dr. Laurence Ferrari: [phone]  - Dr. Nicole Kindred: [phone]  In the event of inclement weather, please call our main line at [phone] for an  update on the status of any delays or closures.  Dermatology Medication Tips: Please keep the boxes that topical medications come in in order to help keep track of the instructions about where and how to use these. Pharmacies typically print the medication instructions only on the boxes and not directly on the medication tubes.   If your medication is too expensive, please contact our office at [phone] option 4 or send [place] a message through [place].   We are unable to tell what your co-pay for medications will be in advance as this is different depending on your insurance coverage. However, we may be able to find a substitute medication at lower cost or fill out paperwork to get insurance to cover a needed medication.   If a prior authorization is required to get your medication covered by your insurance company, please allow [place] 1-2 business days to complete this process.  Drug prices often vary depending on where the prescription is filled and some pharmacies may offer cheaper prices.  The website www.goodrx.com contains coupons for medications through different pharmacies. The prices here do not account for what the cost may be with help from insurance (it may be cheaper with your insurance), but the website can give you the price if you did not use any insurance.  - You can print the associated coupon and take it with your prescription to the pharmacy.  - You may also stop by our office during regular business hours and pick up a GoodRx coupon card.  - If you need your prescription sent electronically to a different pharmacy, notify our office through [facility] or by phone at [phone] option 4.     Si Usted Necesita Algo Despus de Su Visita  Tambin puede enviarnos un mensaje a travs de Pharmacist, community. Por lo general respondemos a los mensajes de MyChart en el transcurso de 1 a 2 das hbiles.  Para renovar recetas, por favor pida a su farmacia que se ponga en contacto con  nuestra oficina. Harland Dingwall de fax es [place] [phone].  Si tiene un asunto urgente cuando la clnica est cerrada y que no puede esperar hasta el siguiente da hbil, puede llamar/localizar a su doctor(a) al nmero que aparece a continuacin.   Por favor, tenga en cuenta que aunque hacemos todo lo posible para estar disponibles para asuntos urgentes fuera del horario de [place], no estamos disponibles las 24 horas del da, los 7 das de la [place].   Si tiene un problema urgente y no puede comunicarse con nosotros, puede optar por buscar atencin mdica  en el consultorio de su doctor(a), en una clnica privada, en un centro de atencin urgente o en una sala de emergencias.  Si tiene Engineering geologist, por favor llame inmediatamente al 911 o vaya a la sala de emergencias.  Nmeros de bper  - Dr. Nehemiah Massed: [phone]  - Dra. Moye: [phone]  - Dra.  Nicole Kindred: [phone]  En caso de inclemencias del [place], por favor llame a [place] Kindred principal al [phone] para una actualizacin sobre el [place] de cualquier retraso o cierre.  Consejos para la medicacin en dermatologa: Por favor, guarde las cajas en las que vienen los medicamentos de uso tpico para ayudarle a seguir las instrucciones sobre dnde y cmo usarlos. Las farmacias generalmente imprimen las instrucciones del medicamento slo en las cajas y no directamente en los tubos del [place].   Si su medicamento es muy caro, por favor, pngase en contacto con Zigmund Daniel llamando al [phone] y presione la opcin 4 o envenos un mensaje a travs de Pharmacist, community.   No podemos decirle cul ser su copago por los medicamentos por adelantado ya que esto es diferente dependiendo de la cobertura de su seguro. Sin embargo, es posible que podamos encontrar un medicamento sustituto a Electrical engineer un formulario para que el seguro cubra el medicamento que se considera necesario.   Si se requiere una autorizacin  previa para que su compaa de seguros [place] su medicamento, por favor permtanos de 1 a 2 das hbiles para completar este proceso.  Los precios de los medicamentos varan con frecuencia dependiendo del Environmental consultant de dnde se surte la receta y alguna farmacias pueden ofrecer precios ms baratos.  El sitio web www.goodrx.com tiene cupones para medicamentos de Airline pilot. Los precios aqu no tienen en cuenta lo que podra costar con la ayuda del seguro (puede ser ms barato con su seguro), pero el sitio web puede darle el precio si no utiliz Research scientist (physical sciences).  - Puede imprimir el cupn correspondiente y llevarlo con su receta a la farmacia.  - Tambin puede pasar por nuestra oficina durante el horario de atencin regular y Charity fundraiser una tarjeta de cupones de GoodRx.  - Si necesita que su receta se enve electrnicamente a una farmacia diferente, informe a nuestra oficina a travs de MyChart de [place] o por telfono llamando al [phone] y presione la opcin 4.

## 2022-01-17 ENCOUNTER — Encounter (INDEPENDENT_AMBULATORY_CARE_PROVIDER_SITE_OTHER): Payer: PPO | Admitting: Ophthalmology

## 2022-01-17 DIAGNOSIS — I10 Essential (primary) hypertension: Secondary | ICD-10-CM | POA: Diagnosis not present

## 2022-01-17 DIAGNOSIS — H43813 Vitreous degeneration, bilateral: Secondary | ICD-10-CM | POA: Diagnosis not present

## 2022-01-17 DIAGNOSIS — H35033 Hypertensive retinopathy, bilateral: Secondary | ICD-10-CM

## 2022-01-17 DIAGNOSIS — E113313 Type 2 diabetes mellitus with moderate nonproliferative diabetic retinopathy with macular edema, bilateral: Secondary | ICD-10-CM

## 2022-01-20 ENCOUNTER — Telehealth: Payer: Self-pay

## 2022-01-20 DIAGNOSIS — L989 Disorder of the skin and subcutaneous tissue, unspecified: Secondary | ICD-10-CM

## 2022-01-20 NOTE — Telephone Encounter (Signed)
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

## 2022-01-20 NOTE — Telephone Encounter (Signed)
Advised pt of bx results and advised pt referral will be sent to Dr. Lacinda Axon.

## 2022-01-21 DIAGNOSIS — E139 Other specified diabetes mellitus without complications: Secondary | ICD-10-CM | POA: Diagnosis not present

## 2022-01-21 DIAGNOSIS — E118 Type 2 diabetes mellitus with unspecified complications: Secondary | ICD-10-CM | POA: Diagnosis not present

## 2022-01-21 DIAGNOSIS — E11319 Type 2 diabetes mellitus with unspecified diabetic retinopathy without macular edema: Secondary | ICD-10-CM | POA: Diagnosis not present

## 2022-01-21 DIAGNOSIS — E782 Mixed hyperlipidemia: Secondary | ICD-10-CM | POA: Diagnosis not present

## 2022-01-21 DIAGNOSIS — I10 Essential (primary) hypertension: Secondary | ICD-10-CM | POA: Diagnosis not present

## 2022-01-27 ENCOUNTER — Telehealth: Payer: Self-pay

## 2022-01-27 NOTE — Telephone Encounter (Signed)
Pt called she would like to cancel her appt with Dr Lacinda Axon this week, she would like her Mohs surgery with Dr Sarajane Jews at skin surgery appt in [place].  Okay referral sent to Dr Sarajane Jews at skin surgery center in [place]

## 2022-01-27 NOTE — Telephone Encounter (Signed)
Dr Sarajane Jews is not accepting Mohs referrals from outside practices discussed with pt, so pt will call Dr Jonathon Jordan office to reschedule her Mohs appt

## 2022-01-30 DIAGNOSIS — C44702 Unspecified malignant neoplasm of skin of right lower limb, including hip: Secondary | ICD-10-CM | POA: Diagnosis not present

## 2022-02-14 ENCOUNTER — Encounter (INDEPENDENT_AMBULATORY_CARE_PROVIDER_SITE_OTHER): Payer: HMO | Admitting: Ophthalmology

## 2022-02-14 DIAGNOSIS — H43813 Vitreous degeneration, bilateral: Secondary | ICD-10-CM | POA: Diagnosis not present

## 2022-02-14 DIAGNOSIS — E113313 Type 2 diabetes mellitus with moderate nonproliferative diabetic retinopathy with macular edema, bilateral: Secondary | ICD-10-CM | POA: Diagnosis not present

## 2022-02-14 DIAGNOSIS — H35033 Hypertensive retinopathy, bilateral: Secondary | ICD-10-CM

## 2022-02-14 DIAGNOSIS — I10 Essential (primary) hypertension: Secondary | ICD-10-CM | POA: Diagnosis not present

## 2022-03-21 ENCOUNTER — Encounter (INDEPENDENT_AMBULATORY_CARE_PROVIDER_SITE_OTHER): Payer: HMO | Admitting: Ophthalmology

## 2022-03-24 ENCOUNTER — Encounter (INDEPENDENT_AMBULATORY_CARE_PROVIDER_SITE_OTHER): Payer: HMO | Admitting: Ophthalmology

## 2022-03-24 DIAGNOSIS — H35033 Hypertensive retinopathy, bilateral: Secondary | ICD-10-CM | POA: Diagnosis not present

## 2022-03-24 DIAGNOSIS — E113313 Type 2 diabetes mellitus with moderate nonproliferative diabetic retinopathy with macular edema, bilateral: Secondary | ICD-10-CM | POA: Diagnosis not present

## 2022-03-24 DIAGNOSIS — I10 Essential (primary) hypertension: Secondary | ICD-10-CM | POA: Diagnosis not present

## 2022-03-24 DIAGNOSIS — H43813 Vitreous degeneration, bilateral: Secondary | ICD-10-CM

## 2022-03-31 ENCOUNTER — Ambulatory Visit: Payer: HMO | Admitting: Podiatry

## 2022-04-28 ENCOUNTER — Encounter (INDEPENDENT_AMBULATORY_CARE_PROVIDER_SITE_OTHER): Payer: HMO | Admitting: Ophthalmology

## 2022-04-28 DIAGNOSIS — H35033 Hypertensive retinopathy, bilateral: Secondary | ICD-10-CM

## 2022-04-28 DIAGNOSIS — E113313 Type 2 diabetes mellitus with moderate nonproliferative diabetic retinopathy with macular edema, bilateral: Secondary | ICD-10-CM | POA: Diagnosis not present

## 2022-04-28 DIAGNOSIS — H43813 Vitreous degeneration, bilateral: Secondary | ICD-10-CM

## 2022-04-28 DIAGNOSIS — I10 Essential (primary) hypertension: Secondary | ICD-10-CM | POA: Diagnosis not present

## 2022-05-26 ENCOUNTER — Encounter (INDEPENDENT_AMBULATORY_CARE_PROVIDER_SITE_OTHER): Payer: HMO | Admitting: Ophthalmology

## 2022-05-26 DIAGNOSIS — E113313 Type 2 diabetes mellitus with moderate nonproliferative diabetic retinopathy with macular edema, bilateral: Secondary | ICD-10-CM | POA: Diagnosis not present

## 2022-05-26 DIAGNOSIS — H43813 Vitreous degeneration, bilateral: Secondary | ICD-10-CM

## 2022-05-26 DIAGNOSIS — I10 Essential (primary) hypertension: Secondary | ICD-10-CM

## 2022-05-26 DIAGNOSIS — H35033 Hypertensive retinopathy, bilateral: Secondary | ICD-10-CM

## 2022-06-25 ENCOUNTER — Encounter (INDEPENDENT_AMBULATORY_CARE_PROVIDER_SITE_OTHER): Payer: PPO | Admitting: Ophthalmology

## 2022-06-25 DIAGNOSIS — H35033 Hypertensive retinopathy, bilateral: Secondary | ICD-10-CM | POA: Diagnosis not present

## 2022-06-25 DIAGNOSIS — I10 Essential (primary) hypertension: Secondary | ICD-10-CM | POA: Diagnosis not present

## 2022-06-25 DIAGNOSIS — H43813 Vitreous degeneration, bilateral: Secondary | ICD-10-CM | POA: Diagnosis not present

## 2022-06-25 DIAGNOSIS — E113313 Type 2 diabetes mellitus with moderate nonproliferative diabetic retinopathy with macular edema, bilateral: Secondary | ICD-10-CM

## 2022-07-28 ENCOUNTER — Encounter (INDEPENDENT_AMBULATORY_CARE_PROVIDER_SITE_OTHER): Payer: PPO | Admitting: Ophthalmology

## 2022-07-28 DIAGNOSIS — H43813 Vitreous degeneration, bilateral: Secondary | ICD-10-CM

## 2022-07-28 DIAGNOSIS — E113313 Type 2 diabetes mellitus with moderate nonproliferative diabetic retinopathy with macular edema, bilateral: Secondary | ICD-10-CM

## 2022-07-28 DIAGNOSIS — I10 Essential (primary) hypertension: Secondary | ICD-10-CM | POA: Diagnosis not present

## 2022-07-28 DIAGNOSIS — H35033 Hypertensive retinopathy, bilateral: Secondary | ICD-10-CM | POA: Diagnosis not present

## 2022-08-19 ENCOUNTER — Ambulatory Visit: Payer: PPO | Admitting: Dermatology

## 2022-09-01 ENCOUNTER — Encounter (INDEPENDENT_AMBULATORY_CARE_PROVIDER_SITE_OTHER): Payer: PPO | Admitting: Ophthalmology

## 2022-09-01 DIAGNOSIS — Z961 Presence of intraocular lens: Secondary | ICD-10-CM

## 2022-09-01 DIAGNOSIS — E113313 Type 2 diabetes mellitus with moderate nonproliferative diabetic retinopathy with macular edema, bilateral: Secondary | ICD-10-CM | POA: Diagnosis not present

## 2022-09-01 DIAGNOSIS — H35033 Hypertensive retinopathy, bilateral: Secondary | ICD-10-CM

## 2022-09-01 DIAGNOSIS — H2511 Age-related nuclear cataract, right eye: Secondary | ICD-10-CM

## 2022-09-01 DIAGNOSIS — H43813 Vitreous degeneration, bilateral: Secondary | ICD-10-CM

## 2022-09-01 DIAGNOSIS — I10 Essential (primary) hypertension: Secondary | ICD-10-CM

## 2022-09-09 DIAGNOSIS — E118 Type 2 diabetes mellitus with unspecified complications: Secondary | ICD-10-CM | POA: Diagnosis not present

## 2022-09-09 DIAGNOSIS — I10 Essential (primary) hypertension: Secondary | ICD-10-CM | POA: Diagnosis not present

## 2022-09-09 DIAGNOSIS — E782 Mixed hyperlipidemia: Secondary | ICD-10-CM | POA: Diagnosis not present

## 2022-09-18 DIAGNOSIS — E11319 Type 2 diabetes mellitus with unspecified diabetic retinopathy without macular edema: Secondary | ICD-10-CM | POA: Diagnosis not present

## 2022-09-18 DIAGNOSIS — I10 Essential (primary) hypertension: Secondary | ICD-10-CM | POA: Diagnosis not present

## 2022-09-18 DIAGNOSIS — E139 Other specified diabetes mellitus without complications: Secondary | ICD-10-CM | POA: Diagnosis not present

## 2022-09-18 DIAGNOSIS — E782 Mixed hyperlipidemia: Secondary | ICD-10-CM | POA: Diagnosis not present

## 2022-10-06 ENCOUNTER — Encounter (INDEPENDENT_AMBULATORY_CARE_PROVIDER_SITE_OTHER): Payer: PPO | Admitting: Ophthalmology

## 2022-10-06 DIAGNOSIS — E113312 Type 2 diabetes mellitus with moderate nonproliferative diabetic retinopathy with macular edema, left eye: Secondary | ICD-10-CM | POA: Diagnosis not present

## 2022-10-06 DIAGNOSIS — E113211 Type 2 diabetes mellitus with mild nonproliferative diabetic retinopathy with macular edema, right eye: Secondary | ICD-10-CM

## 2022-10-06 DIAGNOSIS — H35033 Hypertensive retinopathy, bilateral: Secondary | ICD-10-CM | POA: Diagnosis not present

## 2022-10-06 DIAGNOSIS — H43813 Vitreous degeneration, bilateral: Secondary | ICD-10-CM | POA: Diagnosis not present

## 2022-10-06 DIAGNOSIS — I10 Essential (primary) hypertension: Secondary | ICD-10-CM

## 2022-10-14 DIAGNOSIS — I10 Essential (primary) hypertension: Secondary | ICD-10-CM | POA: Diagnosis not present

## 2022-10-14 DIAGNOSIS — M810 Age-related osteoporosis without current pathological fracture: Secondary | ICD-10-CM | POA: Diagnosis not present

## 2022-10-14 DIAGNOSIS — E782 Mixed hyperlipidemia: Secondary | ICD-10-CM | POA: Diagnosis not present

## 2022-10-21 DIAGNOSIS — E559 Vitamin D deficiency, unspecified: Secondary | ICD-10-CM | POA: Diagnosis not present

## 2022-10-21 DIAGNOSIS — Z0000 Encounter for general adult medical examination without abnormal findings: Secondary | ICD-10-CM | POA: Diagnosis not present

## 2022-10-21 DIAGNOSIS — M419 Scoliosis, unspecified: Secondary | ICD-10-CM | POA: Diagnosis not present

## 2022-10-21 DIAGNOSIS — I10 Essential (primary) hypertension: Secondary | ICD-10-CM | POA: Diagnosis not present

## 2022-10-21 DIAGNOSIS — H3530 Unspecified macular degeneration: Secondary | ICD-10-CM | POA: Diagnosis not present

## 2022-10-21 DIAGNOSIS — E782 Mixed hyperlipidemia: Secondary | ICD-10-CM | POA: Diagnosis not present

## 2022-10-21 DIAGNOSIS — Z79899 Other long term (current) drug therapy: Secondary | ICD-10-CM | POA: Diagnosis not present

## 2022-10-21 DIAGNOSIS — R946 Abnormal results of thyroid function studies: Secondary | ICD-10-CM | POA: Diagnosis not present

## 2022-10-21 DIAGNOSIS — K219 Gastro-esophageal reflux disease without esophagitis: Secondary | ICD-10-CM | POA: Diagnosis not present

## 2022-10-21 DIAGNOSIS — M810 Age-related osteoporosis without current pathological fracture: Secondary | ICD-10-CM | POA: Diagnosis not present

## 2022-10-21 DIAGNOSIS — E11311 Type 2 diabetes mellitus with unspecified diabetic retinopathy with macular edema: Secondary | ICD-10-CM | POA: Diagnosis not present

## 2022-11-04 DIAGNOSIS — Z6821 Body mass index (BMI) 21.0-21.9, adult: Secondary | ICD-10-CM | POA: Diagnosis not present

## 2022-11-04 DIAGNOSIS — N952 Postmenopausal atrophic vaginitis: Secondary | ICD-10-CM | POA: Diagnosis not present

## 2022-11-04 DIAGNOSIS — Z01419 Encounter for gynecological examination (general) (routine) without abnormal findings: Secondary | ICD-10-CM | POA: Diagnosis not present

## 2022-11-04 DIAGNOSIS — M810 Age-related osteoporosis without current pathological fracture: Secondary | ICD-10-CM | POA: Diagnosis not present

## 2022-11-04 DIAGNOSIS — Z1231 Encounter for screening mammogram for malignant neoplasm of breast: Secondary | ICD-10-CM | POA: Diagnosis not present

## 2022-11-10 ENCOUNTER — Encounter (INDEPENDENT_AMBULATORY_CARE_PROVIDER_SITE_OTHER): Payer: PPO | Admitting: Ophthalmology

## 2022-11-10 DIAGNOSIS — Z794 Long term (current) use of insulin: Secondary | ICD-10-CM | POA: Diagnosis not present

## 2022-11-10 DIAGNOSIS — H35033 Hypertensive retinopathy, bilateral: Secondary | ICD-10-CM

## 2022-11-10 DIAGNOSIS — E113313 Type 2 diabetes mellitus with moderate nonproliferative diabetic retinopathy with macular edema, bilateral: Secondary | ICD-10-CM

## 2022-11-10 DIAGNOSIS — H43813 Vitreous degeneration, bilateral: Secondary | ICD-10-CM | POA: Diagnosis not present

## 2022-11-10 DIAGNOSIS — I10 Essential (primary) hypertension: Secondary | ICD-10-CM | POA: Diagnosis not present

## 2022-12-15 ENCOUNTER — Encounter (INDEPENDENT_AMBULATORY_CARE_PROVIDER_SITE_OTHER): Payer: PPO | Admitting: Ophthalmology

## 2022-12-15 DIAGNOSIS — H2511 Age-related nuclear cataract, right eye: Secondary | ICD-10-CM

## 2022-12-15 DIAGNOSIS — H43813 Vitreous degeneration, bilateral: Secondary | ICD-10-CM

## 2022-12-15 DIAGNOSIS — Z794 Long term (current) use of insulin: Secondary | ICD-10-CM | POA: Diagnosis not present

## 2022-12-15 DIAGNOSIS — I10 Essential (primary) hypertension: Secondary | ICD-10-CM | POA: Diagnosis not present

## 2022-12-15 DIAGNOSIS — H35033 Hypertensive retinopathy, bilateral: Secondary | ICD-10-CM

## 2022-12-15 DIAGNOSIS — E113313 Type 2 diabetes mellitus with moderate nonproliferative diabetic retinopathy with macular edema, bilateral: Secondary | ICD-10-CM

## 2023-01-12 ENCOUNTER — Encounter (INDEPENDENT_AMBULATORY_CARE_PROVIDER_SITE_OTHER): Payer: PPO | Admitting: Ophthalmology

## 2023-01-12 DIAGNOSIS — Z794 Long term (current) use of insulin: Secondary | ICD-10-CM

## 2023-01-12 DIAGNOSIS — E113313 Type 2 diabetes mellitus with moderate nonproliferative diabetic retinopathy with macular edema, bilateral: Secondary | ICD-10-CM

## 2023-01-12 DIAGNOSIS — H2511 Age-related nuclear cataract, right eye: Secondary | ICD-10-CM

## 2023-01-12 DIAGNOSIS — H35033 Hypertensive retinopathy, bilateral: Secondary | ICD-10-CM

## 2023-01-12 DIAGNOSIS — I10 Essential (primary) hypertension: Secondary | ICD-10-CM | POA: Diagnosis not present

## 2023-01-12 DIAGNOSIS — H43813 Vitreous degeneration, bilateral: Secondary | ICD-10-CM | POA: Diagnosis not present

## 2023-02-17 ENCOUNTER — Encounter (INDEPENDENT_AMBULATORY_CARE_PROVIDER_SITE_OTHER): Payer: PPO | Admitting: Ophthalmology

## 2023-02-17 DIAGNOSIS — E113313 Type 2 diabetes mellitus with moderate nonproliferative diabetic retinopathy with macular edema, bilateral: Secondary | ICD-10-CM | POA: Diagnosis not present

## 2023-02-17 DIAGNOSIS — H43813 Vitreous degeneration, bilateral: Secondary | ICD-10-CM | POA: Diagnosis not present

## 2023-02-17 DIAGNOSIS — H35033 Hypertensive retinopathy, bilateral: Secondary | ICD-10-CM

## 2023-02-17 DIAGNOSIS — I10 Essential (primary) hypertension: Secondary | ICD-10-CM

## 2023-02-17 DIAGNOSIS — Z794 Long term (current) use of insulin: Secondary | ICD-10-CM | POA: Diagnosis not present

## 2023-03-10 DIAGNOSIS — E139 Other specified diabetes mellitus without complications: Secondary | ICD-10-CM | POA: Diagnosis not present

## 2023-03-23 ENCOUNTER — Encounter (INDEPENDENT_AMBULATORY_CARE_PROVIDER_SITE_OTHER): Payer: PPO | Admitting: Ophthalmology

## 2023-03-23 DIAGNOSIS — H35033 Hypertensive retinopathy, bilateral: Secondary | ICD-10-CM | POA: Diagnosis not present

## 2023-03-23 DIAGNOSIS — E113313 Type 2 diabetes mellitus with moderate nonproliferative diabetic retinopathy with macular edema, bilateral: Secondary | ICD-10-CM | POA: Diagnosis not present

## 2023-03-23 DIAGNOSIS — H2511 Age-related nuclear cataract, right eye: Secondary | ICD-10-CM | POA: Diagnosis not present

## 2023-03-23 DIAGNOSIS — Z794 Long term (current) use of insulin: Secondary | ICD-10-CM

## 2023-03-23 DIAGNOSIS — H26492 Other secondary cataract, left eye: Secondary | ICD-10-CM

## 2023-03-23 DIAGNOSIS — H43813 Vitreous degeneration, bilateral: Secondary | ICD-10-CM | POA: Diagnosis not present

## 2023-03-23 DIAGNOSIS — I10 Essential (primary) hypertension: Secondary | ICD-10-CM

## 2023-03-31 ENCOUNTER — Encounter (INDEPENDENT_AMBULATORY_CARE_PROVIDER_SITE_OTHER): Payer: PPO | Admitting: Ophthalmology

## 2023-04-27 ENCOUNTER — Encounter (INDEPENDENT_AMBULATORY_CARE_PROVIDER_SITE_OTHER): Payer: PPO | Admitting: Ophthalmology

## 2023-04-27 DIAGNOSIS — H35033 Hypertensive retinopathy, bilateral: Secondary | ICD-10-CM | POA: Diagnosis not present

## 2023-04-27 DIAGNOSIS — I10 Essential (primary) hypertension: Secondary | ICD-10-CM

## 2023-04-27 DIAGNOSIS — H2511 Age-related nuclear cataract, right eye: Secondary | ICD-10-CM

## 2023-04-27 DIAGNOSIS — E113313 Type 2 diabetes mellitus with moderate nonproliferative diabetic retinopathy with macular edema, bilateral: Secondary | ICD-10-CM | POA: Diagnosis not present

## 2023-04-27 DIAGNOSIS — Z794 Long term (current) use of insulin: Secondary | ICD-10-CM | POA: Diagnosis not present

## 2023-04-27 DIAGNOSIS — H43813 Vitreous degeneration, bilateral: Secondary | ICD-10-CM

## 2023-06-01 ENCOUNTER — Encounter (INDEPENDENT_AMBULATORY_CARE_PROVIDER_SITE_OTHER): Payer: PPO | Admitting: Ophthalmology

## 2023-06-01 DIAGNOSIS — H2511 Age-related nuclear cataract, right eye: Secondary | ICD-10-CM | POA: Diagnosis not present

## 2023-06-01 DIAGNOSIS — H26492 Other secondary cataract, left eye: Secondary | ICD-10-CM | POA: Diagnosis not present

## 2023-06-01 DIAGNOSIS — I10 Essential (primary) hypertension: Secondary | ICD-10-CM

## 2023-06-01 DIAGNOSIS — H43813 Vitreous degeneration, bilateral: Secondary | ICD-10-CM

## 2023-06-01 DIAGNOSIS — E113313 Type 2 diabetes mellitus with moderate nonproliferative diabetic retinopathy with macular edema, bilateral: Secondary | ICD-10-CM | POA: Diagnosis not present

## 2023-06-01 DIAGNOSIS — H35033 Hypertensive retinopathy, bilateral: Secondary | ICD-10-CM | POA: Diagnosis not present

## 2023-06-01 DIAGNOSIS — Z794 Long term (current) use of insulin: Secondary | ICD-10-CM

## 2023-07-06 ENCOUNTER — Encounter (INDEPENDENT_AMBULATORY_CARE_PROVIDER_SITE_OTHER): Payer: PPO | Admitting: Ophthalmology

## 2023-07-13 ENCOUNTER — Encounter (INDEPENDENT_AMBULATORY_CARE_PROVIDER_SITE_OTHER): Payer: PPO | Admitting: Ophthalmology

## 2023-07-13 DIAGNOSIS — H43813 Vitreous degeneration, bilateral: Secondary | ICD-10-CM | POA: Diagnosis not present

## 2023-07-13 DIAGNOSIS — Z794 Long term (current) use of insulin: Secondary | ICD-10-CM | POA: Diagnosis not present

## 2023-07-13 DIAGNOSIS — I10 Essential (primary) hypertension: Secondary | ICD-10-CM | POA: Diagnosis not present

## 2023-07-13 DIAGNOSIS — H2511 Age-related nuclear cataract, right eye: Secondary | ICD-10-CM

## 2023-07-13 DIAGNOSIS — H35033 Hypertensive retinopathy, bilateral: Secondary | ICD-10-CM | POA: Diagnosis not present

## 2023-07-13 DIAGNOSIS — E113313 Type 2 diabetes mellitus with moderate nonproliferative diabetic retinopathy with macular edema, bilateral: Secondary | ICD-10-CM

## 2023-07-29 DIAGNOSIS — H25011 Cortical age-related cataract, right eye: Secondary | ICD-10-CM | POA: Diagnosis not present

## 2023-07-29 DIAGNOSIS — E113393 Type 2 diabetes mellitus with moderate nonproliferative diabetic retinopathy without macular edema, bilateral: Secondary | ICD-10-CM | POA: Diagnosis not present

## 2023-07-29 DIAGNOSIS — H524 Presbyopia: Secondary | ICD-10-CM | POA: Diagnosis not present

## 2023-07-29 DIAGNOSIS — H2511 Age-related nuclear cataract, right eye: Secondary | ICD-10-CM | POA: Diagnosis not present

## 2023-07-29 DIAGNOSIS — H26492 Other secondary cataract, left eye: Secondary | ICD-10-CM | POA: Diagnosis not present

## 2023-08-17 ENCOUNTER — Encounter (INDEPENDENT_AMBULATORY_CARE_PROVIDER_SITE_OTHER): Payer: PPO | Admitting: Ophthalmology

## 2023-08-19 DIAGNOSIS — H5703 Miosis: Secondary | ICD-10-CM | POA: Diagnosis not present

## 2023-08-19 DIAGNOSIS — H25011 Cortical age-related cataract, right eye: Secondary | ICD-10-CM | POA: Diagnosis not present

## 2023-08-19 DIAGNOSIS — H2511 Age-related nuclear cataract, right eye: Secondary | ICD-10-CM | POA: Diagnosis not present

## 2023-08-19 DIAGNOSIS — H268 Other specified cataract: Secondary | ICD-10-CM | POA: Diagnosis not present

## 2023-08-25 ENCOUNTER — Encounter (INDEPENDENT_AMBULATORY_CARE_PROVIDER_SITE_OTHER): Payer: PPO | Admitting: Ophthalmology

## 2023-08-25 DIAGNOSIS — Z794 Long term (current) use of insulin: Secondary | ICD-10-CM | POA: Diagnosis not present

## 2023-08-25 DIAGNOSIS — H43813 Vitreous degeneration, bilateral: Secondary | ICD-10-CM

## 2023-08-25 DIAGNOSIS — H35033 Hypertensive retinopathy, bilateral: Secondary | ICD-10-CM

## 2023-08-25 DIAGNOSIS — I10 Essential (primary) hypertension: Secondary | ICD-10-CM | POA: Diagnosis not present

## 2023-08-25 DIAGNOSIS — E113313 Type 2 diabetes mellitus with moderate nonproliferative diabetic retinopathy with macular edema, bilateral: Secondary | ICD-10-CM | POA: Diagnosis not present

## 2023-08-31 ENCOUNTER — Encounter (INDEPENDENT_AMBULATORY_CARE_PROVIDER_SITE_OTHER): Admitting: Ophthalmology

## 2023-08-31 DIAGNOSIS — H26492 Other secondary cataract, left eye: Secondary | ICD-10-CM | POA: Diagnosis not present

## 2023-09-28 ENCOUNTER — Encounter (INDEPENDENT_AMBULATORY_CARE_PROVIDER_SITE_OTHER): Admitting: Ophthalmology

## 2023-09-30 ENCOUNTER — Encounter (INDEPENDENT_AMBULATORY_CARE_PROVIDER_SITE_OTHER): Admitting: Ophthalmology

## 2023-09-30 DIAGNOSIS — E113313 Type 2 diabetes mellitus with moderate nonproliferative diabetic retinopathy with macular edema, bilateral: Secondary | ICD-10-CM | POA: Diagnosis not present

## 2023-09-30 DIAGNOSIS — Z794 Long term (current) use of insulin: Secondary | ICD-10-CM | POA: Diagnosis not present

## 2023-09-30 DIAGNOSIS — H43813 Vitreous degeneration, bilateral: Secondary | ICD-10-CM

## 2023-09-30 DIAGNOSIS — H35033 Hypertensive retinopathy, bilateral: Secondary | ICD-10-CM | POA: Diagnosis not present

## 2023-09-30 DIAGNOSIS — I10 Essential (primary) hypertension: Secondary | ICD-10-CM | POA: Diagnosis not present

## 2023-10-15 DIAGNOSIS — E11311 Type 2 diabetes mellitus with unspecified diabetic retinopathy with macular edema: Secondary | ICD-10-CM | POA: Diagnosis not present

## 2023-10-15 DIAGNOSIS — I10 Essential (primary) hypertension: Secondary | ICD-10-CM | POA: Diagnosis not present

## 2023-10-15 DIAGNOSIS — M810 Age-related osteoporosis without current pathological fracture: Secondary | ICD-10-CM | POA: Diagnosis not present

## 2023-10-15 DIAGNOSIS — R946 Abnormal results of thyroid function studies: Secondary | ICD-10-CM | POA: Diagnosis not present

## 2023-10-15 DIAGNOSIS — E559 Vitamin D deficiency, unspecified: Secondary | ICD-10-CM | POA: Diagnosis not present

## 2023-10-15 DIAGNOSIS — Z79899 Other long term (current) drug therapy: Secondary | ICD-10-CM | POA: Diagnosis not present

## 2023-10-15 DIAGNOSIS — E782 Mixed hyperlipidemia: Secondary | ICD-10-CM | POA: Diagnosis not present

## 2023-10-15 DIAGNOSIS — K219 Gastro-esophageal reflux disease without esophagitis: Secondary | ICD-10-CM | POA: Diagnosis not present

## 2023-10-26 DIAGNOSIS — I10 Essential (primary) hypertension: Secondary | ICD-10-CM | POA: Diagnosis not present

## 2023-10-26 DIAGNOSIS — M419 Scoliosis, unspecified: Secondary | ICD-10-CM | POA: Diagnosis not present

## 2023-10-26 DIAGNOSIS — H6120 Impacted cerumen, unspecified ear: Secondary | ICD-10-CM | POA: Diagnosis not present

## 2023-10-26 DIAGNOSIS — E139 Other specified diabetes mellitus without complications: Secondary | ICD-10-CM | POA: Diagnosis not present

## 2023-10-26 DIAGNOSIS — E782 Mixed hyperlipidemia: Secondary | ICD-10-CM | POA: Diagnosis not present

## 2023-10-26 DIAGNOSIS — Z0000 Encounter for general adult medical examination without abnormal findings: Secondary | ICD-10-CM | POA: Diagnosis not present

## 2023-11-02 ENCOUNTER — Encounter (INDEPENDENT_AMBULATORY_CARE_PROVIDER_SITE_OTHER): Admitting: Ophthalmology

## 2023-11-02 DIAGNOSIS — H35033 Hypertensive retinopathy, bilateral: Secondary | ICD-10-CM | POA: Diagnosis not present

## 2023-11-02 DIAGNOSIS — I10 Essential (primary) hypertension: Secondary | ICD-10-CM | POA: Diagnosis not present

## 2023-11-02 DIAGNOSIS — Z794 Long term (current) use of insulin: Secondary | ICD-10-CM | POA: Diagnosis not present

## 2023-11-02 DIAGNOSIS — H43813 Vitreous degeneration, bilateral: Secondary | ICD-10-CM

## 2023-11-02 DIAGNOSIS — E113313 Type 2 diabetes mellitus with moderate nonproliferative diabetic retinopathy with macular edema, bilateral: Secondary | ICD-10-CM | POA: Diagnosis not present

## 2023-11-30 ENCOUNTER — Encounter (INDEPENDENT_AMBULATORY_CARE_PROVIDER_SITE_OTHER): Admitting: Ophthalmology

## 2023-11-30 DIAGNOSIS — H35033 Hypertensive retinopathy, bilateral: Secondary | ICD-10-CM | POA: Diagnosis not present

## 2023-11-30 DIAGNOSIS — E113313 Type 2 diabetes mellitus with moderate nonproliferative diabetic retinopathy with macular edema, bilateral: Secondary | ICD-10-CM

## 2023-11-30 DIAGNOSIS — H43813 Vitreous degeneration, bilateral: Secondary | ICD-10-CM

## 2023-11-30 DIAGNOSIS — Z794 Long term (current) use of insulin: Secondary | ICD-10-CM | POA: Diagnosis not present

## 2023-11-30 DIAGNOSIS — I10 Essential (primary) hypertension: Secondary | ICD-10-CM | POA: Diagnosis not present

## 2023-12-02 DIAGNOSIS — Z1231 Encounter for screening mammogram for malignant neoplasm of breast: Secondary | ICD-10-CM | POA: Diagnosis not present

## 2023-12-02 DIAGNOSIS — Z6820 Body mass index (BMI) 20.0-20.9, adult: Secondary | ICD-10-CM | POA: Diagnosis not present

## 2023-12-02 DIAGNOSIS — Z01419 Encounter for gynecological examination (general) (routine) without abnormal findings: Secondary | ICD-10-CM | POA: Diagnosis not present

## 2023-12-02 DIAGNOSIS — N958 Other specified menopausal and perimenopausal disorders: Secondary | ICD-10-CM | POA: Diagnosis not present

## 2023-12-02 DIAGNOSIS — M816 Localized osteoporosis [Lequesne]: Secondary | ICD-10-CM | POA: Diagnosis not present

## 2023-12-02 DIAGNOSIS — R2231 Localized swelling, mass and lump, right upper limb: Secondary | ICD-10-CM | POA: Diagnosis not present

## 2023-12-04 ENCOUNTER — Other Ambulatory Visit: Payer: Self-pay | Admitting: Obstetrics and Gynecology

## 2023-12-04 DIAGNOSIS — N6310 Unspecified lump in the right breast, unspecified quadrant: Secondary | ICD-10-CM

## 2023-12-17 ENCOUNTER — Ambulatory Visit
Admission: RE | Admit: 2023-12-17 | Discharge: 2023-12-17 | Disposition: A | Discharge status: Home / Self Care | Payer: Self-pay | Source: Ambulatory Visit | Attending: Obstetrics and Gynecology | Admitting: Obstetrics and Gynecology

## 2023-12-17 DIAGNOSIS — N6310 Unspecified lump in the right breast, unspecified quadrant: Secondary | ICD-10-CM

## 2023-12-28 ENCOUNTER — Encounter (INDEPENDENT_AMBULATORY_CARE_PROVIDER_SITE_OTHER): Admitting: Ophthalmology

## 2023-12-28 DIAGNOSIS — H43813 Vitreous degeneration, bilateral: Secondary | ICD-10-CM

## 2023-12-28 DIAGNOSIS — Z794 Long term (current) use of insulin: Secondary | ICD-10-CM

## 2023-12-28 DIAGNOSIS — H35033 Hypertensive retinopathy, bilateral: Secondary | ICD-10-CM | POA: Diagnosis not present

## 2023-12-28 DIAGNOSIS — I10 Essential (primary) hypertension: Secondary | ICD-10-CM

## 2023-12-28 DIAGNOSIS — E113313 Type 2 diabetes mellitus with moderate nonproliferative diabetic retinopathy with macular edema, bilateral: Secondary | ICD-10-CM | POA: Diagnosis not present

## 2023-12-29 ENCOUNTER — Encounter: Payer: Self-pay | Admitting: Dermatology

## 2023-12-29 ENCOUNTER — Ambulatory Visit: Payer: Self-pay | Admitting: Dermatology

## 2023-12-29 DIAGNOSIS — Z872 Personal history of diseases of the skin and subcutaneous tissue: Secondary | ICD-10-CM | POA: Diagnosis not present

## 2023-12-29 DIAGNOSIS — L729 Follicular cyst of the skin and subcutaneous tissue, unspecified: Secondary | ICD-10-CM

## 2023-12-29 DIAGNOSIS — L821 Other seborrheic keratosis: Secondary | ICD-10-CM

## 2023-12-29 DIAGNOSIS — L570 Actinic keratosis: Secondary | ICD-10-CM | POA: Diagnosis not present

## 2023-12-29 DIAGNOSIS — D1801 Hemangioma of skin and subcutaneous tissue: Secondary | ICD-10-CM

## 2023-12-29 DIAGNOSIS — L820 Inflamed seborrheic keratosis: Secondary | ICD-10-CM

## 2023-12-29 DIAGNOSIS — B079 Viral wart, unspecified: Secondary | ICD-10-CM

## 2023-12-29 DIAGNOSIS — Z7189 Other specified counseling: Secondary | ICD-10-CM

## 2023-12-29 DIAGNOSIS — W908XXA Exposure to other nonionizing radiation, initial encounter: Secondary | ICD-10-CM | POA: Diagnosis not present

## 2023-12-29 DIAGNOSIS — L578 Other skin changes due to chronic exposure to nonionizing radiation: Secondary | ICD-10-CM | POA: Diagnosis not present

## 2023-12-29 NOTE — Patient Instructions (Addendum)
 Cryotherapy Aftercare  Wash gently with soap and water everyday.   Apply Vaseline and Band-Aid daily until healed.   Recommend using Curad Mediplast pads. Cut to fit wart or callus. Cover with Elastoplast waterproof tape or any waterproof band-aid. Change every 3 to 4 days, or sooner if necessary.  Treatment may require several months of regular use before results are seen.  Due to recent changes in healthcare laws, you may see results of your pathology and/or laboratory studies on MyChart before the doctors have had a chance to review them. We understand that in some cases there may be results that are confusing or concerning to you. Please understand that not all results are received at the same time and often the doctors may need to interpret multiple results in order to provide you with the best plan of care or course of treatment. Therefore, we ask that you please give us  2 business days to thoroughly review all your results before contacting the office for clarification. Should we see a critical lab result, you will be contacted sooner.   If You Need Anything After Your Visit  If you have any questions or concerns for your doctor, please call our main line at [phone] and press option 4 to reach your doctor's medical assistant. If no one answers, please leave a voicemail as directed and we will return your call as soon as possible. Messages left after 4 pm will be answered the following business day.   You may also send us  a message via MyChart. We typically respond to MyChart messages within 1-2 business days.  For prescription refills, please ask your pharmacy to contact our office. Our fax number is [phone].  If you have an urgent issue when the clinic is closed that cannot wait until the next business day, you can page your doctor at the number below.    Please note that while we do our best to be available for urgent issues outside of office hours, we are not available 24/7.    If you have an urgent issue and are unable to reach us , you may choose to seek medical care at your doctor's office, retail clinic, urgent care center, or emergency room.  If you have a medical emergency, please immediately call 911 or go to the emergency department.  Pager Numbers  - Dr. Hester: [phone]  - Dr. Jackquline: [phone]  - Dr. Claudene: [phone]   In the event of inclement weather, please call our main line at [phone] for an update on the status of any delays or closures.  Dermatology Medication Tips: Please keep the boxes that topical medications come in in order to help keep track of the instructions about where and how to use these. Pharmacies typically print the medication instructions only on the boxes and not directly on the medication tubes.   If your medication is too expensive, please contact our office at [phone] option 4 or send us  a message through MyChart.   We are unable to tell what your co-pay for medications will be in advance as this is different depending on your insurance coverage. However, we may be able to find a substitute medication at lower cost or fill out paperwork to get insurance to cover a needed medication.   If a prior authorization is required to get your medication covered by your insurance company, please allow us  1-2 business days to complete this process.  Drug prices often vary depending on where the prescription is filled and some  pharmacies may offer cheaper prices.  The website www.goodrx.com contains coupons for medications through different pharmacies. The prices here do not account for what the cost may be with help from insurance (it may be cheaper with your insurance), but the website can give you the price if you did not use any insurance.  - You can print the associated coupon and take it with your prescription to the pharmacy.  - You may also stop by our office during regular business hours and pick up a  GoodRx coupon card.  - If you need your prescription sent electronically to a different pharmacy, notify our office through [facility] or by phone at [phone] option 4.     Si Usted Necesita Algo Despus de Su Visita  Tambin puede enviarnos un mensaje a travs de Clinical cytogeneticist. Por lo general respondemos a los mensajes de MyChart en el transcurso de 1 a 2 das hbiles.  Para renovar recetas, por favor pida a su farmacia que se ponga en contacto con nuestra oficina. Randi lakes de fax es [place] [phone].  Si tiene un asunto urgente cuando la clnica est cerrada y que no puede esperar hasta el siguiente da hbil, puede llamar/localizar a su doctor(a) al nmero que aparece a continuacin.   Por favor, tenga en cuenta que aunque hacemos todo lo posible para estar disponibles para asuntos urgentes fuera del horario de [place], no estamos disponibles las 24 horas del da, los 7 [address]  [address] de la [place].   Si tiene un problema urgente y no puede comunicarse con nosotros, puede optar por buscar atencin mdica  en el consultorio de su doctor(a), en una clnica privada, en un centro de atencin urgente o en una sala de emergencias.  Si tiene Engineer, drilling, por favor llame inmediatamente al 911 o vaya a la sala de emergencias.  Nmeros de bper  - Dr. Hester: [phone]  - Dra. Jackquline: [phone]  - Dr. Claudene: [phone]   En caso de inclemencias del tiempo, por favor llame a landry capes principal al [phone] para una actualizacin sobre el [place] de cualquier retraso o cierre.  Consejos para la medicacin en dermatologa: Por favor, guarde las cajas en las que vienen los medicamentos de uso tpico para ayudarle a seguir las instrucciones sobre dnde y cmo usarlos. Las farmacias generalmente imprimen las instrucciones del medicamento slo en las cajas y no directamente en los tubos del [place].   Si su medicamento es muy caro, por favor, pngase en contacto  con landry rieger llamando al [phone] y presione la opcin 4 o envenos un mensaje a travs de Clinical cytogeneticist.   No podemos decirle cul ser su copago por los medicamentos por adelantado ya que esto es diferente dependiendo de la cobertura de su seguro. Sin embargo, es posible que podamos encontrar un medicamento sustituto a Audiological scientist un formulario para que el seguro cubra el medicamento que se considera necesario.   Si se requiere una autorizacin previa para que su compaa de seguros [place] su medicamento, por favor permtanos de 1 a 2 das hbiles para completar este proceso.  Los precios de los medicamentos varan con frecuencia dependiendo del Environmental consultant de dnde se surte la receta y alguna farmacias pueden ofrecer precios ms baratos.  El sitio web www.goodrx.com tiene cupones para medicamentos de Health and safety inspector. Los precios aqu no tienen en cuenta lo que podra costar con la ayuda del seguro (puede ser ms barato con su seguro), pero el sitio web puede darle el precio  si no utiliz [employer].  - Puede imprimir el cupn correspondiente y llevarlo con su receta a la farmacia.  - Tambin puede pasar por nuestra oficina durante el horario de atencin regular y Education officer, museum una tarjeta de cupones de GoodRx.  - Si necesita que su receta se enve electrnicamente a una farmacia diferente, informe a nuestra oficina a travs de MyChart de [place] o por telfono llamando al [phone] y presione la opcin 4.

## 2023-12-29 NOTE — Progress Notes (Signed)
 Follow-Up Visit   Subjective  Michaela Walsh is a 73 y.o. female who presents for the following: dry spots at face, spot at right shoulder. Patient had ultrasound done for a pea sized spot at right axilla, was told it was benign cyst. Spot in between right great toe and 2nd toe, sometimes rubs when she walks and gets irritated.    The following portions of the chart were reviewed this encounter and updated as appropriate: medications, allergies, medical history  Review of Systems:  No other skin or systemic complaints except as noted in HPI or Assessment and Plan.  Objective  Well appearing patient in no apparent distress; mood and affect are within normal limits.   A focused examination was performed of the following areas: Right foot, back, shoulder, axilla  Relevant exam findings are noted in the Assessment and Plan.  R medial 2nd toe Verrucous papule 4 mm -- Discussed viral etiology and contagion.  R medial shoulder at braline x 1, spinal upper back x 1 (2) Erythematous stuck-on, waxy papule or plaque R cheek x 2, R medial cheek x1, R nasal dorsum x 1, R nasal tip x 1, L forehead x 11, R glabella x 1, L nasal tip x 1, L alar crease x 1, L cheek x 1, R upper lip x 1 (21) Erythematous thin papules/macules with gritty scale.   Assessment & Plan   HEMANGIOMA Exam: red papule(s) posterior neck Discussed benign nature. Recommend observation. Call for changes.  History of ATYPICAL CELLULAR PROLIFERATION (DSFP?) at right great plantar toe, Mohs 01/2022 - No evidence of recurrence today - Recommend regular full body skin exams - Recommend daily broad spectrum sunscreen SPF 30+ to sun-exposed areas, reapply every 2 hours as needed.  - Call if any new or changing lesions are noted between office visits  Cyst vs Other Exam: 0.5 cm movable firm subcutaneous nodule at right axilla, not attached to skin  Treatment Plan: Patient has had ultrasound done and was told it was benign  EIC/sebaceous cyst but should be monitored by dermatology. Consider excision with Dr. Paci if changes.  ACTINIC DAMAGE WITH PRECANCEROUS ACTINIC KERATOSES Counseling for Topical Chemotherapy Management: Patient exhibits: - Severe, confluent actinic changes with pre-cancerous actinic keratoses that is secondary to cumulative UV radiation exposure over time - Condition that is severe; chronic, not at goal. - diffuse scaly erythematous macules and papules with underlying dyspigmentation - Discussed Prescription Field Treatment topical Chemotherapy for Severe, Chronic Confluent Actinic Changes with Pre-Cancerous Actinic Keratoses Field treatment involves treatment of an entire area of skin that has confluent Actinic Changes (Sun/ Ultraviolet light damage) and PreCancerous Actinic Keratoses by method of PhotoDynamic Therapy (PDT) and/or prescription Topical Chemotherapy agents such as 5-fluorouracil, 5-fluorouracil/calcipotriene, and/or imiquimod.  The purpose is to decrease the number of clinically evident and subclinical PreCancerous lesions to prevent progression to development of skin cancer by chemically destroying early precancer changes that may or may not be visible.  It has been shown to reduce the risk of developing skin cancer in the treated area. As a result of treatment, redness, scaling, crusting, and open sores may occur during treatment course. One or more than one of these methods may be used and may have to be used several times to control, suppress and eliminate the PreCancerous changes. Discussed treatment course, expected reaction, and possible side effects. - Recommend daily broad spectrum sunscreen SPF 30+ to sun-exposed areas, reapply every 2 hours as needed.  - Staying in the shade or  wearing long sleeves, sun glasses (UVA+UVB protection) and wide brim hats (4-inch brim around the entire circumference of the hat) are also recommended. - Call for new or changing lesions. - -  Recommend red light PDT with debridement to face.  Patient will schedule in 3 months    SEBORRHEIC KERATOSIS - Stuck-on, waxy, tan-brown papules and/or plaques  - Benign-appearing - Discussed benign etiology and prognosis. - Observe - Call for any changes  VIRAL WARTS, UNSPECIFIED TYPE R medial 2nd toe Viral Wart (HPV) Counseling  Discussed viral / HPV (Human Papilloma Virus) etiology and risk of spread /infectivity to other areas of body as well as to other people.  Multiple treatments and methods may be required to clear warts and it is possible treatment may not be successful.  Treatment risks include discoloration; scarring and there is still potential for wart recurrence.  Recommend using Curad Mediplast pads. Cut to fit wart or callus. Cover with Elastoplast waterproof tape or any waterproof band-aid. Change every 3 to 4 days, or sooner if necessary.  Treatment may require several months of regular use before results are seen.  Destruction of lesion - R medial 2nd toe  Destruction method: cryotherapy   Informed consent: discussed and consent obtained   Lesion destroyed using liquid nitrogen: Yes   Region frozen until ice ball extended beyond lesion: Yes   Outcome: patient tolerated procedure well with no complications   Post-procedure details: wound care instructions given   Additional details:  Prior to procedure, discussed risks of blister formation, small wound, skin dyspigmentation, or rare scar following cryotherapy. Recommend Vaseline ointment to treated areas while healing.   INFLAMED SEBORRHEIC KERATOSIS (2) R medial shoulder at braline x 1, spinal upper back x 1 (2) Symptomatic, irritating, patient would like treated.  Benign-appearing.  Call clinic for new or changing lesions.   Destruction of lesion - R medial shoulder at braline x 1, spinal upper back x 1 (2)  Destruction method: cryotherapy   Informed consent: discussed and consent obtained   Lesion destroyed  using liquid nitrogen: Yes   Region frozen until ice ball extended beyond lesion: Yes   Outcome: patient tolerated procedure well with no complications   Post-procedure details: wound care instructions given   Additional details:  Prior to procedure, discussed risks of blister formation, small wound, skin dyspigmentation, or rare scar following cryotherapy. Recommend Vaseline ointment to treated areas while healing.   AK (ACTINIC KERATOSIS) (21) R cheek x 2, R medial cheek x1, R nasal dorsum x 1, R nasal tip x 1, L forehead x 11, R glabella x 1, L nasal tip x 1, L alar crease x 1, L cheek x 1, R upper lip x 1 (21) Actinic keratoses are precancerous spots that appear secondary to cumulative UV radiation exposure/sun exposure over time. They are chronic with expected duration over 1 year. A portion of actinic keratoses will progress to squamous cell carcinoma of the skin. It is not possible to reliably predict which spots will progress to skin cancer and so treatment is recommended to prevent development of skin cancer.  Recommend daily broad spectrum sunscreen SPF 30+ to sun-exposed areas, reapply every 2 hours as needed.  Recommend staying in the shade or wearing long sleeves, sun glasses (UVA+UVB protection) and wide brim hats (4-inch brim around the entire circumference of the hat). Call for new or changing lesions. Destruction of lesion - R cheek x 2, R medial cheek x1, R nasal dorsum x 1, R nasal tip  x 1, L forehead x 11, R glabella x 1, L nasal tip x 1, L alar crease x 1, L cheek x 1, R upper lip x 1 (21)  Destruction method: cryotherapy   Informed consent: discussed and consent obtained   Lesion destroyed using liquid nitrogen: Yes   Region frozen until ice ball extended beyond lesion: Yes   Outcome: patient tolerated procedure well with no complications   Post-procedure details: wound care instructions given   Additional details:  Prior to procedure, discussed risks of blister formation,  small wound, skin dyspigmentation, or rare scar following cryotherapy. Recommend Vaseline ointment to treated areas while healing.    Return in about 3 months (around 03/30/2024) for AK follow up, with Dr. Jackquline , PDT face.  LILLETTE Lonell Drones, RMA, am acting as scribe for Rexene Jackquline, MD .   Documentation: I have reviewed the above documentation for accuracy and completeness, and I agree with the above.  Rexene Jackquline, MD

## 2024-01-05 ENCOUNTER — Encounter

## 2024-01-05 ENCOUNTER — Ambulatory Visit: Admitting: Dermatology

## 2024-01-05 DIAGNOSIS — L570 Actinic keratosis: Secondary | ICD-10-CM

## 2024-01-05 MED ORDER — AMINOLEVULINIC ACID HCL 10 % EX GEL
2000.0000 mg | Freq: Once | CUTANEOUS | Status: AC
  Administered 2024-01-05: 2000 mg via TOPICAL

## 2024-01-05 NOTE — Patient Instructions (Signed)

## 2024-01-05 NOTE — Progress Notes (Signed)
 Patient completed red light phototherapy with debridement today to face.Michaela Walsh KERATOSES Exam: Erythematous thin papules/macules with gritty scale.  Treatment Plan:  Red Light Photodynamic therapy  Procedure discussed: discussed risks, benefits, side effects. and alternatives   Prep: site scrubbed/prepped with acetone   Debridement needed: Yes (performed by Physician with sand paper.  (CPT N6148098)) Location:  face Number of lesions:  Multiple (> 15) Type of treatment:  Red light Aminolevulinic Acid (see MAR for details): Ameluz  Aminolevulinic Acid comment:  [id number] Amount of Ameluz  (mg):  1 Incubation time (minutes):  60 Number of minutes under lamp:  20 Cooling:  Fan Outcome: patient tolerated procedure well with no complications   Post-procedure details: sunscreen applied and aftercare instructions given to patient    Related Medications Aminolevulinic Acid HCl 10 % GEL 2,000 mg  Amanda White, RMA  I personally debrided area prior to application of aminolevulinic acid  Documentation: I have reviewed the above documentation for accuracy and completeness, and I agree with the above.  Rexene Rattler, MD

## 2024-01-25 ENCOUNTER — Encounter (INDEPENDENT_AMBULATORY_CARE_PROVIDER_SITE_OTHER): Admitting: Ophthalmology

## 2024-01-25 DIAGNOSIS — H43813 Vitreous degeneration, bilateral: Secondary | ICD-10-CM | POA: Diagnosis not present

## 2024-01-25 DIAGNOSIS — H35033 Hypertensive retinopathy, bilateral: Secondary | ICD-10-CM

## 2024-01-25 DIAGNOSIS — I10 Essential (primary) hypertension: Secondary | ICD-10-CM | POA: Diagnosis not present

## 2024-01-25 DIAGNOSIS — Z794 Long term (current) use of insulin: Secondary | ICD-10-CM | POA: Diagnosis not present

## 2024-01-25 DIAGNOSIS — E113313 Type 2 diabetes mellitus with moderate nonproliferative diabetic retinopathy with macular edema, bilateral: Secondary | ICD-10-CM | POA: Diagnosis not present

## 2024-01-28 DIAGNOSIS — E782 Mixed hyperlipidemia: Secondary | ICD-10-CM | POA: Diagnosis not present

## 2024-01-28 DIAGNOSIS — I10 Essential (primary) hypertension: Secondary | ICD-10-CM | POA: Diagnosis not present

## 2024-01-28 DIAGNOSIS — E139 Other specified diabetes mellitus without complications: Secondary | ICD-10-CM | POA: Diagnosis not present

## 2024-02-23 ENCOUNTER — Encounter (INDEPENDENT_AMBULATORY_CARE_PROVIDER_SITE_OTHER): Admitting: Ophthalmology

## 2024-02-23 DIAGNOSIS — H35033 Hypertensive retinopathy, bilateral: Secondary | ICD-10-CM

## 2024-02-23 DIAGNOSIS — H43813 Vitreous degeneration, bilateral: Secondary | ICD-10-CM | POA: Diagnosis not present

## 2024-02-23 DIAGNOSIS — E113313 Type 2 diabetes mellitus with moderate nonproliferative diabetic retinopathy with macular edema, bilateral: Secondary | ICD-10-CM | POA: Diagnosis not present

## 2024-02-23 DIAGNOSIS — Z794 Long term (current) use of insulin: Secondary | ICD-10-CM | POA: Diagnosis not present

## 2024-02-23 DIAGNOSIS — I10 Essential (primary) hypertension: Secondary | ICD-10-CM

## 2024-03-10 DIAGNOSIS — E139 Other specified diabetes mellitus without complications: Secondary | ICD-10-CM | POA: Diagnosis not present

## 2024-03-10 DIAGNOSIS — I10 Essential (primary) hypertension: Secondary | ICD-10-CM | POA: Diagnosis not present

## 2024-03-10 DIAGNOSIS — E782 Mixed hyperlipidemia: Secondary | ICD-10-CM | POA: Diagnosis not present

## 2024-03-15 DIAGNOSIS — E139 Other specified diabetes mellitus without complications: Secondary | ICD-10-CM | POA: Diagnosis not present

## 2024-03-22 ENCOUNTER — Encounter (INDEPENDENT_AMBULATORY_CARE_PROVIDER_SITE_OTHER): Admitting: Ophthalmology

## 2024-03-22 DIAGNOSIS — I10 Essential (primary) hypertension: Secondary | ICD-10-CM

## 2024-03-22 DIAGNOSIS — Z794 Long term (current) use of insulin: Secondary | ICD-10-CM | POA: Diagnosis not present

## 2024-03-22 DIAGNOSIS — H43813 Vitreous degeneration, bilateral: Secondary | ICD-10-CM | POA: Diagnosis not present

## 2024-03-22 DIAGNOSIS — E113313 Type 2 diabetes mellitus with moderate nonproliferative diabetic retinopathy with macular edema, bilateral: Secondary | ICD-10-CM

## 2024-03-22 DIAGNOSIS — H35033 Hypertensive retinopathy, bilateral: Secondary | ICD-10-CM | POA: Diagnosis not present

## 2024-03-24 DIAGNOSIS — E139 Other specified diabetes mellitus without complications: Secondary | ICD-10-CM | POA: Diagnosis not present

## 2024-03-24 DIAGNOSIS — I10 Essential (primary) hypertension: Secondary | ICD-10-CM | POA: Diagnosis not present

## 2024-03-24 DIAGNOSIS — E782 Mixed hyperlipidemia: Secondary | ICD-10-CM | POA: Diagnosis not present

## 2024-03-29 ENCOUNTER — Encounter: Payer: Self-pay | Admitting: Dermatology

## 2024-03-29 ENCOUNTER — Ambulatory Visit: Admitting: Dermatology

## 2024-03-29 DIAGNOSIS — L821 Other seborrheic keratosis: Secondary | ICD-10-CM | POA: Diagnosis not present

## 2024-03-29 DIAGNOSIS — L820 Inflamed seborrheic keratosis: Secondary | ICD-10-CM | POA: Diagnosis not present

## 2024-03-29 DIAGNOSIS — W908XXA Exposure to other nonionizing radiation, initial encounter: Secondary | ICD-10-CM | POA: Diagnosis not present

## 2024-03-29 DIAGNOSIS — L578 Other skin changes due to chronic exposure to nonionizing radiation: Secondary | ICD-10-CM | POA: Diagnosis not present

## 2024-03-29 DIAGNOSIS — Z872 Personal history of diseases of the skin and subcutaneous tissue: Secondary | ICD-10-CM | POA: Diagnosis not present

## 2024-03-29 DIAGNOSIS — L570 Actinic keratosis: Secondary | ICD-10-CM | POA: Diagnosis not present

## 2024-03-29 NOTE — Progress Notes (Signed)
 Follow-Up Visit   Subjective  Michaela Walsh is a 73 y.o. female who presents for the following: Actinic keratosis.  The patient has spots, moles and lesions to be evaluated, some may be new or changing and the patient may have concern these could be cancer.  Hx of PDT treatment and LN2 treatment to face 12/2023. 3 month follow up. Overall much better, but itchy spots at L forehead hairline.   The following portions of the chart were reviewed this encounter and updated as appropriate: medications, allergies, medical history  Review of Systems:  No other skin or systemic complaints except as noted in HPI or Assessment and Plan.  Objective  Well appearing patient in no apparent distress; mood and affect are within normal limits.  A focused examination was performed of the following areas: Face   Relevant exam findings are noted in the Assessment and Plan.  L forehead at front scalp hair line x10, L temple x1, L eyebrow x2, R upper elbow x1 (14) Erythematous keratotic or waxy stuck-on papule or plaque. nasal dorsum x1, R nasal tip x1, L upper cutaneous lip x1, L lower cheek x1, L temple x2, R forehead x1, R eyebrow x1 (8) Erythematous thin papules/macules with gritty scale.   Assessment & Plan   History of ATYPICAL CELLULAR PROLIFERATION (DSFP?) at right great plantar toe, Mohs 01/2022 - No evidence of recurrence today - Recommend regular full body skin exams - Recommend daily broad spectrum sunscreen SPF 30+ to sun-exposed areas, reapply every 2 hours as needed.  - Call if any new or changing lesions are noted between office visits  SEBORRHEIC KERATOSIS - Stuck-on, waxy, tan-brown papule at intermammary chest, L upper arm  - Benign-appearing - Discussed benign etiology and prognosis. - Observe - Call for any changes  INFLAMED SEBORRHEIC KERATOSIS (14) L forehead at front scalp hair line x10, L temple x1, L eyebrow x2, R upper elbow x1 (14) Vs HyAKs   Symptomatic,  irritating, patient would like treated. Destruction of lesion - L forehead at front scalp hair line x10, L temple x1, L eyebrow x2, R upper elbow x1 (14)  Destruction method: cryotherapy   Informed consent: discussed and consent obtained   Lesion destroyed using liquid nitrogen: Yes   Region frozen until ice ball extended beyond lesion: Yes   Outcome: patient tolerated procedure well with no complications   Post-procedure details: wound care instructions given   Additional details:  Prior to procedure, discussed risks of blister formation, small wound, skin dyspigmentation, or rare scar following cryotherapy. Recommend Vaseline ointment to treated areas while healing.   AK (ACTINIC KERATOSIS) (8) nasal dorsum x1, R nasal tip x1, L upper cutaneous lip x1, L lower cheek x1, L temple x2, R forehead x1, R eyebrow x1 (8) Actinic keratoses are precancerous spots that appear secondary to cumulative UV radiation exposure/sun exposure over time. They are chronic with expected duration over 1 year. A portion of actinic keratoses will progress to squamous cell carcinoma of the skin. It is not possible to reliably predict which spots will progress to skin cancer and so treatment is recommended to prevent development of skin cancer.  Recommend daily broad spectrum sunscreen SPF 30+ to sun-exposed areas, reapply every 2 hours as needed.  Recommend staying in the shade or wearing long sleeves, sun glasses (UVA+UVB protection) and wide brim hats (4-inch brim around the entire circumference of the hat). Call for new or changing lesions. Destruction of lesion - nasal dorsum x1, R nasal  tip x1, L upper cutaneous lip x1, L lower cheek x1, L temple x2, R forehead x1, R eyebrow x1 (8)  Destruction method: cryotherapy   Informed consent: discussed and consent obtained   Lesion destroyed using liquid nitrogen: Yes   Region frozen until ice ball extended beyond lesion: Yes   Outcome: patient tolerated procedure well  with no complications   Post-procedure details: wound care instructions given   Additional details:  Prior to procedure, discussed risks of blister formation, small wound, skin dyspigmentation, or rare scar following cryotherapy. Recommend Vaseline ointment to treated areas while healing.     ACTINIC DAMAGE - chronic, secondary to cumulative UV radiation exposure/sun exposure over time - diffuse scaly erythematous macules with underlying dyspigmentation - Recommend daily broad spectrum sunscreen SPF 30+ to sun-exposed areas, reapply every 2 hours as needed.  - Recommend staying in the shade or wearing long sleeves, sun glasses (UVA+UVB protection) and wide brim hats (4-inch brim around the entire circumference of the hat). - Call for new or changing lesions.  - Good result with red light PDT to face  Return in about 6 months (around 09/27/2024) for AK Follow Up.  I, Jill Parcell, CMA, am acting as scribe for Rexene Rattler, MD.   Documentation: I have reviewed the above documentation for accuracy and completeness, and I agree with the above.  Rexene Rattler, MD

## 2024-03-29 NOTE — Patient Instructions (Signed)
 Cryotherapy Aftercare  Wash gently with soap and water everyday.   Apply Vaseline Jelly daily until healed.     Recommend daily broad spectrum sunscreen SPF 30+ to sun-exposed areas, reapply every 2 hours as needed. Call for new or changing lesions.  Staying in the shade or wearing long sleeves, sun glasses (UVA+UVB protection) and wide brim hats (4-inch brim around the entire circumference of the hat) are also recommended for sun protection.      Due to recent changes in healthcare laws, you may see results of your pathology and/or laboratory studies on MyChart before the doctors have had a chance to review them. We understand that in some cases there may be results that are confusing or concerning to you. Please understand that not all results are received at the same time and often the doctors may need to interpret multiple results in order to provide you with the best plan of care or course of treatment. Therefore, we ask that you please give us  2 business days to thoroughly review all your results before contacting the office for clarification. Should we see a critical lab result, you will be contacted sooner.   If You Need Anything After Your Visit  If you have any questions or concerns for your doctor, please call our main line at [phone] and press option 4 to reach your doctor's medical assistant. If no one answers, please leave a voicemail as directed and we will return your call as soon as possible. Messages left after 4 pm will be answered the following business day.   You may also send us  a message via MyChart. We typically respond to MyChart messages within 1-2 business days.  For prescription refills, please ask your pharmacy to contact our office. Our fax number is [phone].  If you have an urgent issue when the clinic is closed that cannot wait until the next business day, you can page your doctor at the number below.    Please note that while we do our best to be  available for urgent issues outside of office hours, we are not available 24/7.   If you have an urgent issue and are unable to reach us , you may choose to seek medical care at your doctor's office, retail clinic, urgent care center, or emergency room.  If you have a medical emergency, please immediately call 911 or go to the emergency department.  Pager Numbers  - Dr. Hester: [phone]  - Dr. Jackquline: [phone]  - Dr. Claudene: [phone]   - Dr. Raymund: [phone]  In the event of inclement weather, please call our main line at [phone] for an update on the status of any delays or closures.  Dermatology Medication Tips: Please keep the boxes that topical medications come in in order to help keep track of the instructions about where and how to use these. Pharmacies typically print the medication instructions only on the boxes and not directly on the medication tubes.   If your medication is too expensive, please contact our office at [phone] option 4 or send us  a message through MyChart.   We are unable to tell what your co-pay for medications will be in advance as this is different depending on your insurance coverage. However, we may be able to find a substitute medication at lower cost or fill out paperwork to get insurance to cover a needed medication.   If a prior authorization is required to get your medication covered by your insurance company, please allow us   1-2 business days to complete this process.  Drug prices often vary depending on where the prescription is filled and some pharmacies may offer cheaper prices.  The website www.goodrx.com contains coupons for medications through different pharmacies. The prices here do not account for what the cost may be with help from insurance (it may be cheaper with your insurance), but the website can give you the price if you did not use any insurance.  - You can print the associated coupon and take it with your  prescription to the pharmacy.  - You may also stop by our office during regular business hours and pick up a GoodRx coupon card.  - If you need your prescription sent electronically to a different pharmacy, notify our office through [facility] or by phone at [phone] option 4.     Si Usted Necesita Algo Despus de Su Visita  Tambin puede enviarnos un mensaje a travs de Clinical cytogeneticist. Por lo general respondemos a los mensajes de MyChart en el transcurso de 1 a 2 das hbiles.  Para renovar recetas, por favor pida a su farmacia que se ponga en contacto con nuestra oficina. Randi lakes de fax es [place] [phone].  Si tiene un asunto urgente cuando la clnica est cerrada y que no puede esperar hasta el siguiente da hbil, puede llamar/localizar a su doctor(a) al nmero que aparece a continuacin.   Por favor, tenga en cuenta que aunque hacemos todo lo posible para estar disponibles para asuntos urgentes fuera del horario de [place], no estamos disponibles las 24 horas del da, los 7 [address]  [address] de la [place].   Si tiene un problema urgente y no puede comunicarse con nosotros, puede optar por buscar atencin mdica  en el consultorio de su doctor(a), en una clnica privada, en un centro de atencin urgente o en una sala de emergencias.  Si tiene Engineer, drilling, por favor llame inmediatamente al 911 o vaya a la sala de emergencias.  Nmeros de bper  - Dr. Hester: [phone]  - Dra. Jackquline: [phone]  - Dr. Claudene: [phone]  - Dra. Kitts: [phone]  En caso de inclemencias del [place], por favor llame a nuestra lnea principal al [phone] para una actualizacin sobre el estado de cualquier retraso o cierre.  Consejos para la medicacin en dermatologa: Por favor, guarde las cajas en las que vienen los medicamentos de uso tpico para ayudarle a seguir las instrucciones sobre dnde y cmo usarlos. Las farmacias generalmente imprimen las instrucciones del  medicamento slo en las cajas y no directamente en los tubos del [place].   Si su medicamento es muy caro, por favor, pngase en contacto con landry rieger llamando al [phone] y presione la opcin 4 o envenos un mensaje a travs de Clinical cytogeneticist.   No podemos decirle cul ser su copago por los medicamentos por adelantado ya que esto es diferente dependiendo de la cobertura de su seguro. Sin embargo, es posible que podamos encontrar un medicamento sustituto a Audiological scientist un formulario para que el seguro cubra el medicamento que se considera necesario.   Si se requiere una autorizacin previa para que su compaa de seguros malta su medicamento, por favor permtanos de 1 a 2 das hbiles para completar este proceso.  Los precios de los medicamentos varan con frecuencia dependiendo del Environmental consultant de dnde se surte la receta y alguna farmacias pueden ofrecer precios ms baratos.  El sitio web www.goodrx.com tiene cupones para medicamentos de Health and safety inspector. Los precios aqu no tienen en  cuenta lo que podra costar con la ayuda del seguro (puede ser ms barato con su seguro), pero el sitio web puede darle el precio si no Visual merchandiser.  - Puede imprimir el cupn correspondiente y llevarlo con su receta a la farmacia.  - Tambin puede pasar por nuestra oficina durante el horario de atencin regular y Education officer, museum una tarjeta de cupones de GoodRx.  - Si necesita que su receta se enve electrnicamente a una farmacia diferente, informe a nuestra oficina a travs de MyChart de [place] o por telfono llamando al [phone] y presione la opcin 4.

## 2024-04-18 ENCOUNTER — Encounter (INDEPENDENT_AMBULATORY_CARE_PROVIDER_SITE_OTHER): Admitting: Ophthalmology

## 2024-04-18 DIAGNOSIS — H35033 Hypertensive retinopathy, bilateral: Secondary | ICD-10-CM | POA: Diagnosis not present

## 2024-04-18 DIAGNOSIS — I10 Essential (primary) hypertension: Secondary | ICD-10-CM

## 2024-04-18 DIAGNOSIS — Z794 Long term (current) use of insulin: Secondary | ICD-10-CM

## 2024-04-18 DIAGNOSIS — H43813 Vitreous degeneration, bilateral: Secondary | ICD-10-CM

## 2024-04-18 DIAGNOSIS — E113313 Type 2 diabetes mellitus with moderate nonproliferative diabetic retinopathy with macular edema, bilateral: Secondary | ICD-10-CM | POA: Diagnosis not present

## 2024-05-16 ENCOUNTER — Encounter (INDEPENDENT_AMBULATORY_CARE_PROVIDER_SITE_OTHER): Admitting: Ophthalmology

## 2024-05-16 DIAGNOSIS — Z794 Long term (current) use of insulin: Secondary | ICD-10-CM | POA: Diagnosis not present

## 2024-05-16 DIAGNOSIS — E113313 Type 2 diabetes mellitus with moderate nonproliferative diabetic retinopathy with macular edema, bilateral: Secondary | ICD-10-CM

## 2024-05-16 DIAGNOSIS — I10 Essential (primary) hypertension: Secondary | ICD-10-CM | POA: Diagnosis not present

## 2024-05-16 DIAGNOSIS — H43813 Vitreous degeneration, bilateral: Secondary | ICD-10-CM

## 2024-05-16 DIAGNOSIS — H35033 Hypertensive retinopathy, bilateral: Secondary | ICD-10-CM | POA: Diagnosis not present

## 2024-06-13 ENCOUNTER — Encounter (INDEPENDENT_AMBULATORY_CARE_PROVIDER_SITE_OTHER): Admitting: Ophthalmology

## 2024-06-13 DIAGNOSIS — H43813 Vitreous degeneration, bilateral: Secondary | ICD-10-CM

## 2024-06-13 DIAGNOSIS — I10 Essential (primary) hypertension: Secondary | ICD-10-CM | POA: Diagnosis not present

## 2024-06-13 DIAGNOSIS — E113313 Type 2 diabetes mellitus with moderate nonproliferative diabetic retinopathy with macular edema, bilateral: Secondary | ICD-10-CM

## 2024-06-13 DIAGNOSIS — H35033 Hypertensive retinopathy, bilateral: Secondary | ICD-10-CM

## 2024-06-13 DIAGNOSIS — Z794 Long term (current) use of insulin: Secondary | ICD-10-CM

## 2024-07-11 ENCOUNTER — Encounter (INDEPENDENT_AMBULATORY_CARE_PROVIDER_SITE_OTHER): Admitting: Ophthalmology

## 2024-07-11 DIAGNOSIS — Z794 Long term (current) use of insulin: Secondary | ICD-10-CM

## 2024-07-11 DIAGNOSIS — H35033 Hypertensive retinopathy, bilateral: Secondary | ICD-10-CM | POA: Diagnosis not present

## 2024-07-11 DIAGNOSIS — H43813 Vitreous degeneration, bilateral: Secondary | ICD-10-CM | POA: Diagnosis not present

## 2024-07-11 DIAGNOSIS — E113313 Type 2 diabetes mellitus with moderate nonproliferative diabetic retinopathy with macular edema, bilateral: Secondary | ICD-10-CM | POA: Diagnosis not present

## 2024-07-11 DIAGNOSIS — I10 Essential (primary) hypertension: Secondary | ICD-10-CM | POA: Diagnosis not present

## 2024-08-08 ENCOUNTER — Encounter (INDEPENDENT_AMBULATORY_CARE_PROVIDER_SITE_OTHER): Admitting: Ophthalmology

## 2024-09-27 ENCOUNTER — Ambulatory Visit: Admitting: Dermatology
